# Patient Record
Sex: Female | Born: 1970 | Race: White | Hispanic: No | Marital: Married | Smoking: Never smoker
Health system: Southern US, Community
[De-identification: ages and names within clinical notes are randomized; demographics above are authoritative.]

## PROBLEM LIST (undated history)

## (undated) DIAGNOSIS — Z8489 Family history of other specified conditions: Secondary | ICD-10-CM

## (undated) DIAGNOSIS — B958 Unspecified staphylococcus as the cause of diseases classified elsewhere: Secondary | ICD-10-CM

## (undated) DIAGNOSIS — R112 Nausea with vomiting, unspecified: Secondary | ICD-10-CM

## (undated) DIAGNOSIS — F419 Anxiety disorder, unspecified: Secondary | ICD-10-CM

## (undated) DIAGNOSIS — E2839 Other primary ovarian failure: Secondary | ICD-10-CM

## (undated) HISTORY — PX: EYE SURGERY: SHX253

## (undated) HISTORY — DX: Unspecified staphylococcus as the cause of diseases classified elsewhere: B95.8

---
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
## 2008-01-23 ENCOUNTER — Emergency Department (HOSPITAL_COMMUNITY): Admission: EM | Admit: 2008-01-23 | Discharge: 2008-01-23 | Payer: Self-pay | Admitting: Emergency Medicine

## 2008-09-12 ENCOUNTER — Inpatient Hospital Stay (HOSPITAL_COMMUNITY): Admission: AD | Admit: 2008-09-12 | Discharge: 2008-09-12 | Payer: Self-pay | Admitting: Obstetrics & Gynecology

## 2011-08-14 LAB — I-STAT 8, (EC8 V) (CONVERTED LAB)
Glucose, Bld: 91
HCT: 41
Hemoglobin: 13.9
Sodium: 138
TCO2: 29
pCO2, Ven: 49.7
pH, Ven: 7.344 — ABNORMAL HIGH

## 2011-08-14 LAB — POCT I-STAT CREATININE
Creatinine, Ser: 1
Operator id: 288831

## 2011-08-14 LAB — POCT CARDIAC MARKERS
CKMB, poc: <1 — ABNORMAL LOW
Operator id: 288831
Troponin i, poc: <0.05

## 2012-04-09 ENCOUNTER — Encounter (INDEPENDENT_AMBULATORY_CARE_PROVIDER_SITE_OTHER): Payer: Self-pay | Admitting: General Surgery

## 2012-04-09 ENCOUNTER — Ambulatory Visit (INDEPENDENT_AMBULATORY_CARE_PROVIDER_SITE_OTHER): Payer: BC Managed Care – PPO | Admitting: Internal Medicine

## 2012-04-09 VITALS — BP 120/82 | HR 83 | Temp 98.3°F | Ht 66.5 in | Wt 146.8 lb

## 2012-04-09 DIAGNOSIS — L723 Sebaceous cyst: Secondary | ICD-10-CM

## 2012-04-09 NOTE — Patient Instructions (Signed)
Patient may use warm compresses 2-3 times daily. She will continue to monitor the area. I currently do not feel that she does need antibiotics. An ultrasound was performed today which showed no evidence of purulent collection but possibly that there is a small cyst there. Eventually she may need surgical removal of this if it becomes a more consistent problem.

## 2012-04-12 NOTE — Progress Notes (Signed)
Subjective:     Patient ID: Elna Breslow, female   DOB: 05/29/1971, 41 y.o.   MRN: [id number]  HPI Patient is a 41 yr old female who is referred to pur clinic by her primary care MD due to an area on her left lower leg.  She reports this area was first noticed the first week of April.  The area became very red and painful and swollen.  She had to be seen in the ED where she was diagnosed with an abscess and the area was incised and drained.  She has been on Bactrim for this area.  The area over the weekend became painful and slightly red.  She contacted her primary care MD who recommended being seen at a surgeons office.  She relates the pain is in the medial area of the lower leg and extends to the ankle.  She denies fevers or chills.  She denies swelling.  Review of Systems Review of systems is negative accept as indicated in HPI    Objective:   Physical Exam General: Well developed, well nourished, female who appears in no acute distress. Extremity: left lower medial leg has a small scar .5cm, small knotted area located under this but no fluctuance, erythema or significant sign of infection present.  No swelling present.    Assessment:     Residual scar tissue after abscess drainage but no current sign of drainable abscess.    Plan:     An ultrasound was performed today and there is no sign of drainable collection of fluid.  Likely the patient is having discomfort from the scar tissue remaining from the original abscess.  Recommend warm compresses and monitoring.  If the area continues to cause significant discomfort then the remaining cyst compartment can be considered for surgical removal.  The patient will contact us if the area worsens or continues not to improve.     Raunak Antuna 04/12/2012 6:00 AM

## 2015-09-09 ENCOUNTER — Encounter (HOSPITAL_COMMUNITY): Payer: Self-pay | Admitting: Neurology

## 2015-09-09 ENCOUNTER — Emergency Department (HOSPITAL_COMMUNITY)
Admission: EM | Admit: 2015-09-09 | Discharge: 2015-09-09 | Disposition: A | Discharge status: Home / Self Care | Payer: BLUE CROSS/BLUE SHIELD | Attending: Emergency Medicine | Admitting: Emergency Medicine

## 2015-09-09 ENCOUNTER — Emergency Department (HOSPITAL_COMMUNITY): Payer: BLUE CROSS/BLUE SHIELD

## 2015-09-09 DIAGNOSIS — Z792 Long term (current) use of antibiotics: Secondary | ICD-10-CM | POA: Diagnosis not present

## 2015-09-09 DIAGNOSIS — Y9389 Activity, other specified: Secondary | ICD-10-CM | POA: Diagnosis not present

## 2015-09-09 DIAGNOSIS — S92351A Displaced fracture of fifth metatarsal bone, right foot, initial encounter for closed fracture: Secondary | ICD-10-CM | POA: Diagnosis not present

## 2015-09-09 DIAGNOSIS — Y9289 Other specified places as the place of occurrence of the external cause: Secondary | ICD-10-CM | POA: Diagnosis not present

## 2015-09-09 DIAGNOSIS — Y998 Other external cause status: Secondary | ICD-10-CM | POA: Insufficient documentation

## 2015-09-09 DIAGNOSIS — S92911A Unspecified fracture of right toe(s), initial encounter for closed fracture: Secondary | ICD-10-CM

## 2015-09-09 DIAGNOSIS — W2201XA Walked into wall, initial encounter: Secondary | ICD-10-CM | POA: Insufficient documentation

## 2015-09-09 DIAGNOSIS — S99921A Unspecified injury of right foot, initial encounter: Secondary | ICD-10-CM | POA: Diagnosis present

## 2015-09-09 DIAGNOSIS — Z79899 Other long term (current) drug therapy: Secondary | ICD-10-CM | POA: Insufficient documentation

## 2015-09-09 MED ORDER — HYDROCODONE-ACETAMINOPHEN 5-325 MG PO TABS: 1.0000 | ORAL_TABLET | Freq: Four times a day (QID) | ORAL | Status: DC | PRN

## 2015-09-09 MED ORDER — LIDOCAINE HCL 2 % IJ SOLN
20.0000 mL | Freq: Once | INTRAMUSCULAR | Status: AC
  Administered 2015-09-09: 400 mg
  Filled 2015-09-09: qty 20

## 2015-09-09 MED ORDER — HYDROCODONE-ACETAMINOPHEN 5-325 MG PO TABS
1.0000 | ORAL_TABLET | Freq: Once | ORAL | Status: AC
  Administered 2015-09-09: 1 via ORAL
  Filled 2015-09-09: qty 1

## 2015-09-09 NOTE — ED Notes (Signed)
Pt stumped right pinky toe on the wall today. She thinks it is broken.

## 2015-09-09 NOTE — Discharge Instructions (Signed)
Please follow-up with her primary care provider in 2 weeks for reevaluation. If new or worsening signs or symptoms present please return to the emergency room for reevaluation

## 2015-09-09 NOTE — ED Provider Notes (Signed)
CSN: [id number]     Arrival date & time 09/09/15  8295 History  By signing my name below, I, Phillis Haggis, attest that this documentation has been prepared under the direction and in the presence of [employer], PA-C. Electronically Signed: Phillis Haggis, ED Scribe. 09/09/2015. 7:41 PM.  Chief Complaint  Patient presents with  . Toe Injury   The history is provided by the patient. No language interpreter was used.  HPI Comments: Marisa Harris is a 44 y.o. female who presents to the Emergency Department complaining of a right fifth toe injury onset earlier today. Pt states that she stubbed her toe on the wall and believes it is broken. Pt is unable to bear weight on the right foot and arrived to the ED via a wheelchair. She denies hx of similar injuries, numbness, or weakness.   Past Medical History  Diagnosis Date  . Staph infection     left inner leg   Past Surgical History  Procedure Laterality Date  . Knee surgery  1992    right   No family history on file. Social History  Substance Use Topics  . Smoking status: Never Smoker   . Smokeless tobacco: Never Used  . Alcohol Use: Yes     Comment: monthly   OB History    No data available     Review of Systems  All other systems reviewed and are negative.   Allergies  Codeine  Home Medications   Prior to Admission medications   Medication Sig Start Date End Date Taking? Authorizing Provider  b complex vitamins tablet Take 1 tablet by mouth daily.    Historical Provider, MD  HYDROcodone-acetaminophen (NORCO/VICODIN) 5-325 MG tablet Take 1 tablet by mouth every 6 (six) hours as needed. 09/09/15   Eyvonne Mechanic, PA-C  levonorgestrel (MIRENA) 20 MCG/24HR IUD 1 each by Intrauterine route once.    Historical Provider, MD  Multiple Vitamin (MULTIVITAMIN) tablet Take 1 tablet by mouth daily.    Historical Provider, MD  sulfamethoxazole-trimethoprim (BACTRIM DS) 800-160 MG per tablet Take 1 tablet by mouth 2 (two) times  daily.    Historical Provider, MD   BP 117/73 mmHg  Pulse 69  Temp(Src) 98.1 F (36.7 C) (Oral)  Resp 20  SpO2 97%  Physical Exam  Constitutional: She is oriented to person, place, and time. She appears well-developed and well-nourished.  HENT:  Head: Normocephalic and atraumatic.  Eyes: EOM are normal.  Neck: Normal range of motion. Neck supple.  Cardiovascular: Normal rate.   Pulmonary/Chest: Effort normal.  Musculoskeletal: Normal range of motion.  Obvious deformity of the right fifth toe; no soft tissue injury; sensation intact; cap refill intact; tenderness to palpation  Neurological: She is alert and oriented to person, place, and time.  Skin: Skin is warm and dry.  Psychiatric: She has a normal mood and affect. Her behavior is normal.  Nursing note and vitals reviewed.  ED Course  Procedures (including critical care time) DIAGNOSTIC STUDIES: Oxygen Saturation is 97% on RA, normal by my interpretation.    COORDINATION OF CARE: 6:53 PM-Discussed treatment plan which includes x-ray of toe with pt at bedside and pt agreed to plan.   Labs Review Labs Reviewed - No data to display  Imaging Review Dg Toe 5th Right  09/09/2015  CLINICAL DATA:  Stubbed RIGHT 5th toe on a doorway today, deformity EXAM: RIGHT FIFTH TOE COMPARISON:  None FINDINGS: Slightly oblique fractured this dull aspect of proximal phalanx RIGHT fifth toe displaced laterally.  No intra-articular extension. No additional fracture, dislocation or bone destruction. IMPRESSION: Displaced fracture proximal phalanx RIGHT fifth toe. Electronically Signed   By: Ulyses SouthwardMark  Boles M.D.   On: 09/09/2015 19:25   I have personally reviewed and evaluated these images and lab results as part of my medical decision-making.   EKG Interpretation None      MDM   Final diagnoses:  Phalanx fracture, foot, right, closed, initial encounter   Labs:  Imaging:  Dg Toe 5th Right  09/09/2015  CLINICAL DATA:  Stubbed RIGHT 5th  toe on a doorway today, deformity EXAM: RIGHT FIFTH TOE COMPARISON:  None FINDINGS: Slightly oblique fractured this dull aspect of proximal phalanx RIGHT fifth toe displaced laterally. No intra-articular extension. No additional fracture, dislocation or bone destruction. IMPRESSION: Displaced fracture proximal phalanx RIGHT fifth toe. Electronically Signed   By: Ulyses SouthwardMark  Boles M.D.   On: 09/09/2015 19:25   Consults:  Therapeutics:  Discharge Meds:   Assessment/Plan: 44 y.o. female who presents to the ED after hitting her right fifth toe on a wall. There is obvious deformity noted to the toe; will perform an x-ray  X-ray shows displaced fracture of the proximal phalanx. Will apply a digital block to the area and perform a realignment to the toe.   Anatomical alignment reached with digital block and slight traction, Refill present after alignment. Patient buddy taped, instructed follow-up with primary care in 2 weeks for reevaluation, sooner as needed. Strict return precautions given.   I personally performed the services described in this documentation, which was scribed in my presence. The recorded information has been reviewed and is accurate.    Eyvonne MechanicJeffrey Jaivyn Gulla, PA-C 09/10/15 [id number]  Mancel BaleElliott Wentz, MD 09/10/15 2350

## 2016-12-01 ENCOUNTER — Ambulatory Visit (HOSPITAL_COMMUNITY)
Admission: EM | Admit: 2016-12-01 | Discharge: 2016-12-01 | Disposition: A | Discharge status: Home / Self Care | Payer: BLUE CROSS/BLUE SHIELD | Attending: Emergency Medicine | Admitting: Emergency Medicine

## 2016-12-01 ENCOUNTER — Encounter (HOSPITAL_COMMUNITY): Payer: Self-pay | Admitting: *Deleted

## 2016-12-01 ENCOUNTER — Ambulatory Visit (INDEPENDENT_AMBULATORY_CARE_PROVIDER_SITE_OTHER): Payer: BLUE CROSS/BLUE SHIELD

## 2016-12-01 DIAGNOSIS — R091 Pleurisy: Secondary | ICD-10-CM | POA: Diagnosis not present

## 2016-12-01 DIAGNOSIS — R079 Chest pain, unspecified: Secondary | ICD-10-CM

## 2016-12-01 MED ORDER — HYDROCODONE-ACETAMINOPHEN 5-325 MG PO TABS: 1.0000 | ORAL_TABLET | ORAL | 0 refills | Status: DC | PRN

## 2016-12-01 MED ORDER — DICLOFENAC SODIUM 75 MG PO TBEC: 75.0000 mg | DELAYED_RELEASE_TABLET | Freq: Two times a day (BID) | ORAL | 0 refills | Status: DC

## 2016-12-01 MED ORDER — PREDNISONE 10 MG (21) PO TBPK: ORAL_TABLET | ORAL | 0 refills | Status: DC

## 2016-12-01 NOTE — ED Provider Notes (Signed)
HPI  SUBJECTIVE:  Marisa Harris is a 46 y.o. female who presents with over 12 days of central and right-sided chest pain that she describes as constant, achy, stabbing with movement. She reports shortness of breath and dyspnea on exertion although she states that she does not have to stop and rest. No wheezing. She was treated for bronchitis with a zpack, in mid to late December, states that she was coughing "lots". She states that she has not coughed since 12/30, but reports worsening right-sided chest pain. Symptoms are worse with right arm movement, torso rotation, sneezing, deep inspiration. No alleviating factors. She is been taking ibuprofen 800 mg twice a day and has tried icy hot. She denies chest rash, calf pain, leg swelling, hemoptysis, unintentional weight gain, orthopnea, PND, abdominal pain, nocturia. No recent trauma or surgery, exogenous estrogen. No trauma to the chest. She states that she does not feel like she broke her ribs. She has never had symptoms like this before. She has past medical history of cracked rib and states that the symptoms are not like that. No history of asthma, emphysema, COPD, smoking. No history of pneumothorax, DVT, PE, CHF. LMP: 4 years ago. PMD: None   Past Medical History:  Diagnosis Date  . Staph infection    left inner leg    Past Surgical History:  Procedure Laterality Date  . KNEE SURGERY  1992   right    History reviewed. No pertinent family history.  Social History  Substance Use Topics  . Smoking status: Never Smoker  . Smokeless tobacco: Never Used  . Alcohol use Yes     Comment: monthly    No current facility-administered medications for this encounter.   Current Outpatient Prescriptions:  .  diclofenac (VOLTAREN) 75 MG EC tablet, Take 1 tablet (75 mg total) by mouth 2 (two) times daily. Take with food, Disp: 30 tablet, Rfl: 0 .  HYDROcodone-acetaminophen (NORCO/VICODIN) 5-325 MG tablet, Take 1-2 tablets by mouth every 4  (four) hours as needed for moderate pain., Disp: 20 tablet, Rfl: 0 .  Multiple Vitamin (MULTIVITAMIN) tablet, Take 1 tablet by mouth daily., Disp: , Rfl:  .  predniSONE (STERAPRED UNI-PAK 21 TAB) 10 MG (21) TBPK tablet, Dispense one 6 day pack. Take as directed with food., Disp: 21 tablet, Rfl: 0  Allergies  Allergen Reactions  . Codeine Nausea Only     ROS  As noted in HPI.   Physical Exam  BP 124/73 (BP Location: Right Arm)   Pulse 74   Temp 98.1 F (36.7 C) (Oral)   Resp 18   SpO2 100%   Constitutional: Well developed, well nourished, no acute distress Eyes: PERRL, EOMI, conjunctiva normal bilaterally HENT: Normocephalic, atraumatic,mucus membranes moist Respiratory: Clear to auscultation bilaterally, no rales, no wheezing, no rhonchi. Positive costochondral tenderness at the sternum, midclavicular line. Positive diffuse tenderness over the entire right-sided rib cage. pain is aggravated with torso rotation and arm movement. No point bony tenderness. No appreciable crepitus. Patient able to take a deep breath in. Cardiovascular: Normal rate and rhythm, no murmurs, no gallops, no rubs GI: nondistended,  skin: No rash, skin intact Musculoskeletal:  calves symmetric, no edema, no tenderness, no deformities Neurologic: Alert & oriented x 3, CN II-XII grossly intact, no motor deficits, sensation grossly intact Psychiatric: Speech and behavior appropriate   ED Course   Medications - No data to display  Orders Placed This Encounter  Procedures  . DG Chest 2 View    Standing Status:  Standing    Number of Occurrences:   1    Order Specific Question:   Reason for Exam (SYMPTOM  OR DIAGNOSIS REQUIRED)    Answer:   R sided CP r/o PTX, effusion, PNA   No results found for this or any previous visit (from the past 24 hour(s)). Dg Chest 2 View  Result Date: 12/01/2016 CLINICAL DATA:  Patient states that after she became releived of cough at the end of December and since she  had a constant chest pain along rt side breast/ribs area. Non-smoker EXAM: CHEST  2 VIEW COMPARISON:  None. FINDINGS: Midline trachea. Normal heart size and mediastinal contours. No pleural effusion or pneumothorax. Clear lungs. IMPRESSION: No acute cardiopulmonary disease. Electronically Signed   By: Jeronimo GreavesKyle  Talbot M.D.   On: 12/01/2016 19:51    ED Clinical Impression  Right-sided chest pain  Pleurisy   ED Assessment/Plan  No previous records available.  Pt PERC neg. Has normal VS.  Doubt PE. Checking chest x-ray rule out effusion, pneumothorax. She has diffuse chest wall tenderness but no point tenderness suggestive of a broken rib. Presentation most consistent with musculoskeletal chest pain vs pleurisy. Doubt myocarditis, given the location of the pain and the fact that it is reproducible.  Reviewed imaging independently and discussed with radiology. No pleural effusion, pneumothorax. No rib fractures. Clear lungs. See radiology report for details.  Plan to send home with ibuprofen 800 mg 1 g of Tylenol or with 1-2 tabs of Norco and a 6 day prednisone taper. We'll provide a primary care referral. To the ER if she gets worse.  Discussed labs, imaging, MDM, plan and followup with patient. Discussed sn/sx that should prompt return to the ED. Patient agrees with plan.   Meds ordered this encounter  Medications  . diclofenac (VOLTAREN) 75 MG EC tablet    Sig: Take 1 tablet (75 mg total) by mouth 2 (two) times daily. Take with food    Dispense:  30 tablet    Refill:  0  . HYDROcodone-acetaminophen (NORCO/VICODIN) 5-325 MG tablet    Sig: Take 1-2 tablets by mouth every 4 (four) hours as needed for moderate pain.    Dispense:  20 tablet    Refill:  0  . predniSONE (STERAPRED UNI-PAK 21 TAB) 10 MG (21) TBPK tablet    Sig: Dispense one 6 day pack. Take as directed with food.    Dispense:  21 tablet    Refill:  0    *This clinic note was created using Scientist, clinical (histocompatibility and immunogenetics)Dragon dictation software.  Therefore, there may be occasional mistakes despite careful proofreading.  ?   Domenick GongAshley Montine Hight, MD 12/01/16 2130

## 2016-12-01 NOTE — ED Triage Notes (Signed)
Has  Been  Sick     For   About  1  Month  Had  Lots  Of  Coughing   Seen             2.5  Weeks  Ago took  meds  Now  Has  Pain upper  Chest  Radiating  To  Back   Worse   On   Movement  And  Takes  Deep  Breath

## 2018-04-20 IMAGING — DX DG CHEST 2V
2 series · 2 of 2 positions shown · non-contrast
Comparison: None.

CLINICAL DATA: Patient states that after she became releived of
cough at the end [DATE] and since she had a constant chest pain
along rt side breast/ribs area. Non-smoker

EXAM:
CHEST  2 VIEW

[chest pa]
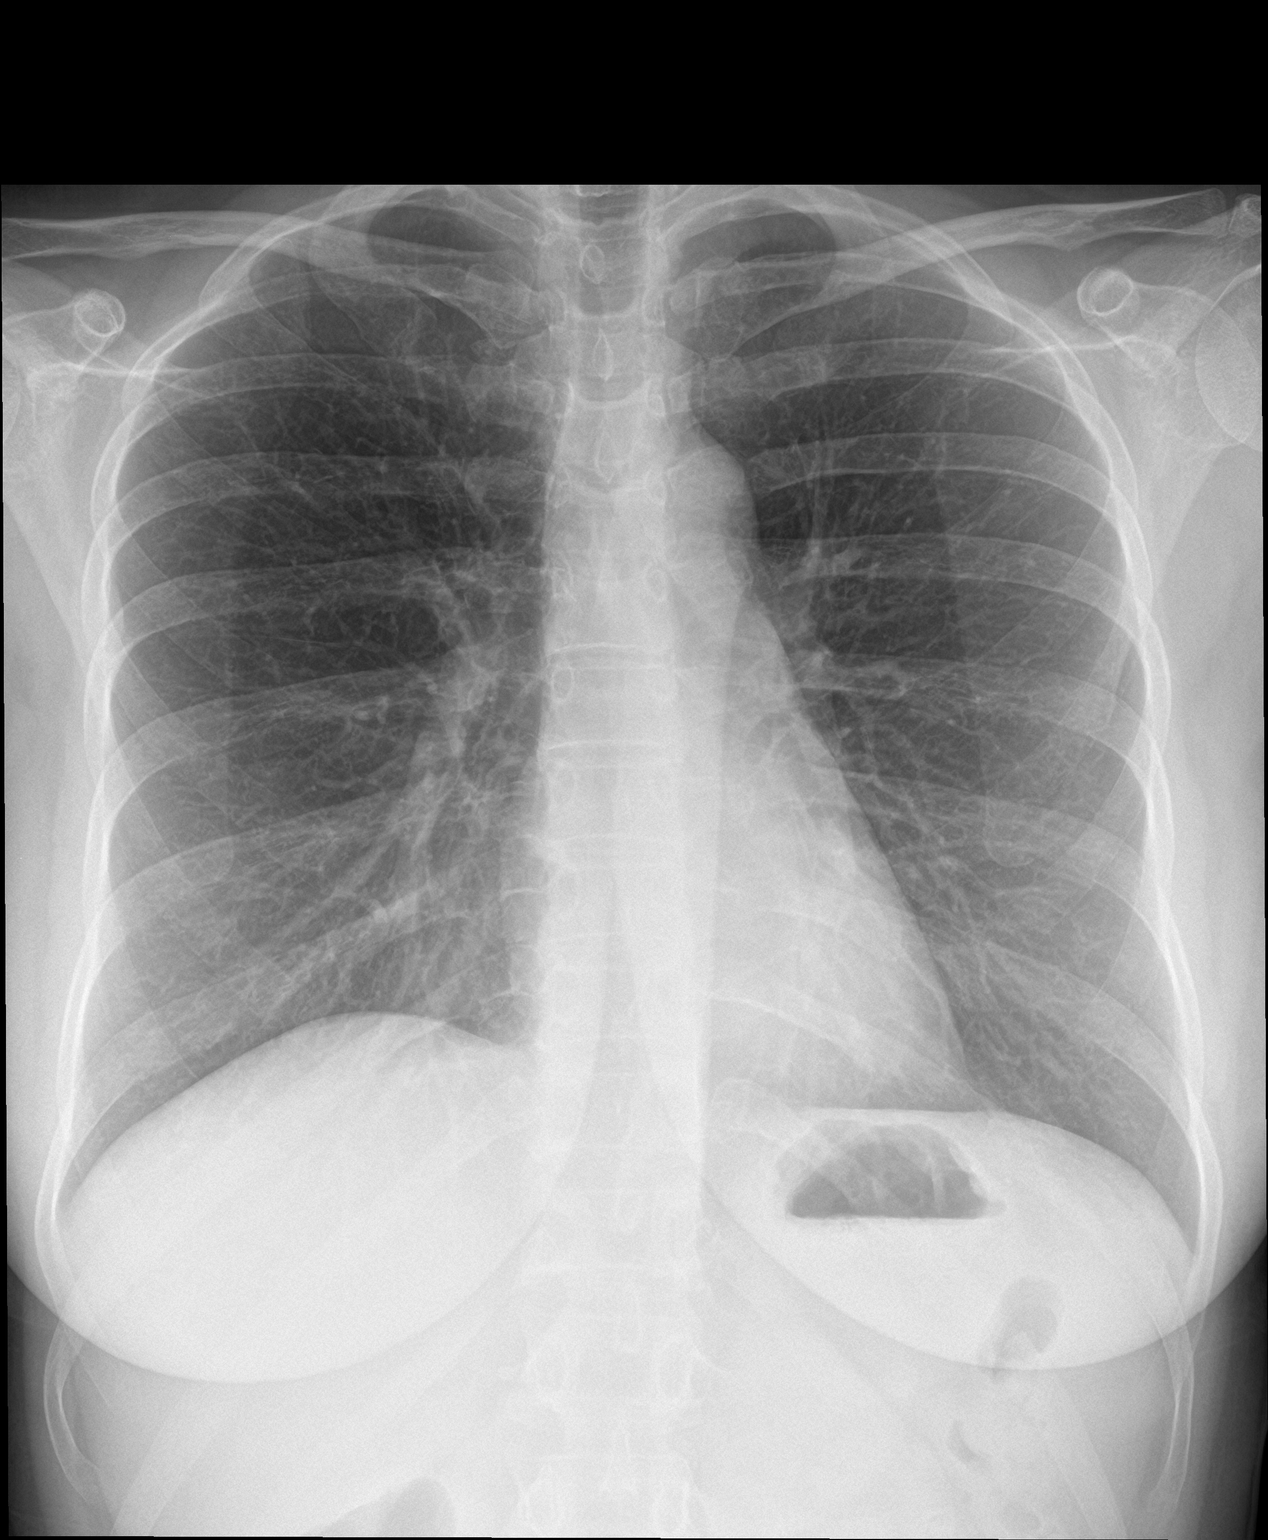

[chest lat]
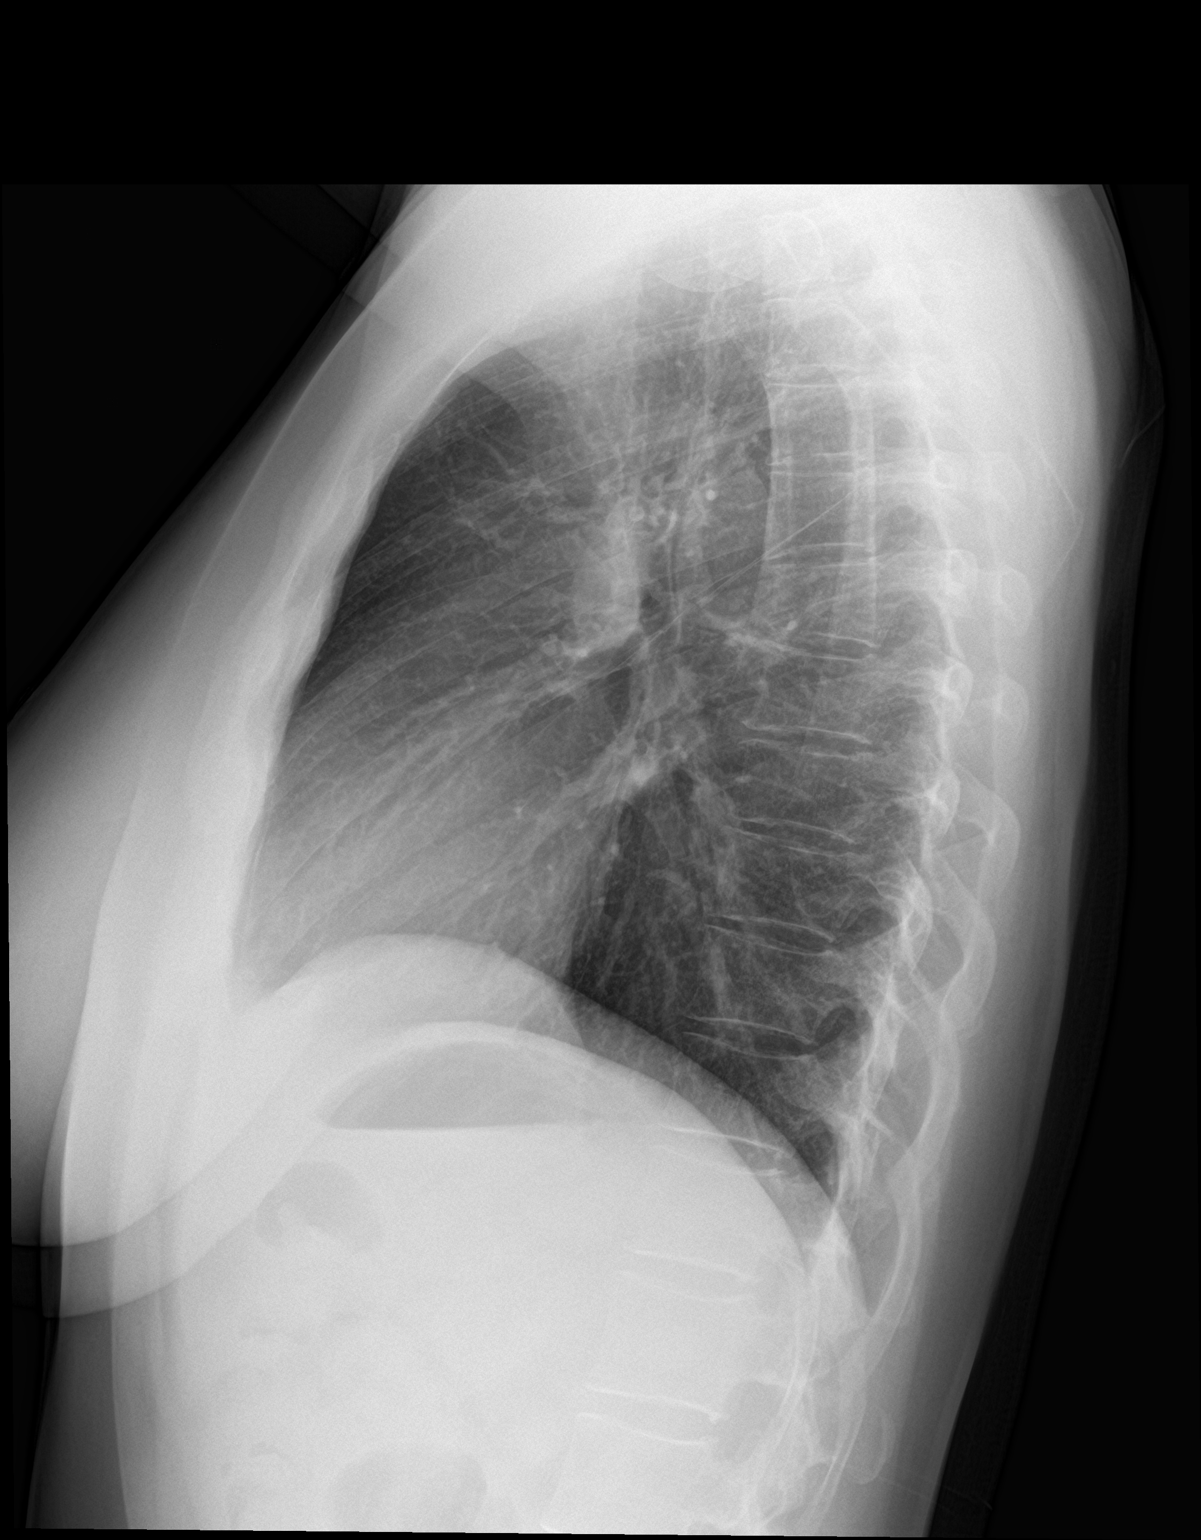

[2 of 2 positions shown; findings below may reference images not displayed]

FINDINGS: Midline trachea. Normal heart size and mediastinal contours. No
pleural effusion or pneumothorax. Clear lungs.
IMPRESSION: No acute cardiopulmonary disease.

## 2022-06-20 ENCOUNTER — Ambulatory Visit (INDEPENDENT_AMBULATORY_CARE_PROVIDER_SITE_OTHER): Payer: BC Managed Care – PPO | Admitting: Plastic Surgery

## 2022-06-20 ENCOUNTER — Encounter: Payer: Self-pay | Admitting: Plastic Surgery

## 2022-06-20 VITALS — BP 116/70 | HR 61 | Ht 67.0 in | Wt 166.6 lb

## 2022-06-20 DIAGNOSIS — N62 Hypertrophy of breast: Secondary | ICD-10-CM | POA: Insufficient documentation

## 2022-06-20 DIAGNOSIS — R21 Rash and other nonspecific skin eruption: Secondary | ICD-10-CM

## 2022-06-20 DIAGNOSIS — M542 Cervicalgia: Secondary | ICD-10-CM | POA: Diagnosis not present

## 2022-06-20 DIAGNOSIS — M546 Pain in thoracic spine: Secondary | ICD-10-CM | POA: Diagnosis not present

## 2022-06-20 DIAGNOSIS — M549 Dorsalgia, unspecified: Secondary | ICD-10-CM | POA: Insufficient documentation

## 2022-06-20 DIAGNOSIS — Z6826 Body mass index (BMI) 26.0-26.9, adult: Secondary | ICD-10-CM

## 2022-06-20 DIAGNOSIS — Z1231 Encounter for screening mammogram for malignant neoplasm of breast: Secondary | ICD-10-CM

## 2022-06-20 DIAGNOSIS — M545 Low back pain, unspecified: Secondary | ICD-10-CM

## 2022-06-20 DIAGNOSIS — G8929 Other chronic pain: Secondary | ICD-10-CM

## 2022-06-20 NOTE — Progress Notes (Signed)
Patient ID: Marisa Harris, female    DOB: 1970-12-02, 51 y.o.   MRN: [id number]   Chief Complaint  Patient presents with   Advice Only    Mammary Hyperplasia: The patient is a 51 y.o. female with a history of mammary hyperplasia for several years.  She has extremely large breasts causing symptoms that include the following: Back pain in the upper and lower back, including neck pain. She pulls or pins her bra straps to provide better lift and relief of the pressure and pain. She notices relief by holding her breast up manually.  Her shoulder straps cause grooves and pain and pressure that requires padding for relief. Pain medication is sometimes required with motrin and tylenol.  Activities that are hindered by enlarged breasts include: exercise and running.  She has tried supportive clothing as well as fitted bras without improvement.  Her breasts are extremely large and fairly symmetric.  She has hyperpigmentation of the inframammary area on both sides.  The sternal to nipple distance on the right is 28 cm and the left is 29 cm.  The IMF distance is 10 cm.  She is 5 feet 7 inches tall and weighs 166 pounds.  The BMI = 26 kg/m.  Preoperative bra size = DDD cup. She would like to be a C cup.  The estimated excess breast tissue to be removed at the time of surgery = 500 grams on the left and 500 grams on the right.  Mammogram history: July 2022.  Family history of breast cancer:  no.  Tobacco use:  no.   The patient expresses the desire to pursue surgical intervention.     Review of Systems  Constitutional:  Positive for activity change. Negative for appetite change.  HENT: Negative.    Eyes: Negative.   Respiratory: Negative.  Negative for chest tightness.   Cardiovascular: Negative.   Gastrointestinal: Negative.   Endocrine: Negative.   Genitourinary: Negative.   Musculoskeletal:  Positive for back pain and neck pain.  Skin:  Positive for rash.  Neurological: Negative.    Hematological: Negative.   Psychiatric/Behavioral: Negative.      Past Medical History:  Diagnosis Date   Staph infection    left inner leg    Past Surgical History:  Procedure Laterality Date   KNEE SURGERY  1992   right      Current Outpatient Medications:    JUNEL FE 24 1-20 MG-MCG(24) tablet, Take 1 tablet by mouth daily., Disp: , Rfl:    Multiple Vitamin (MULTIVITAMIN) tablet, Take 1 tablet by mouth daily., Disp: , Rfl:    Objective:   Vitals:   06/20/22 1318  BP: 116/70  Pulse: 61  SpO2: 98%    Physical Exam Vitals reviewed.  Constitutional:      Appearance: Normal appearance.  HENT:     Head: Normocephalic and atraumatic.  Cardiovascular:     Rate and Rhythm: Normal rate.     Pulses: Normal pulses.  Pulmonary:     Effort: Pulmonary effort is normal.  Abdominal:     General: There is no distension.     Palpations: Abdomen is soft.  Musculoskeletal:        General: No swelling or deformity.  Skin:    General: Skin is warm.     Capillary Refill: Capillary refill takes less than 2 seconds.     Coloration: Skin is not jaundiced.     Findings: No bruising.  Neurological:  Mental Status: She is alert and oriented to person, place, and time.  Psychiatric:        Mood and Affect: Mood normal.        Behavior: Behavior normal.        Thought Content: Thought content normal.        Judgment: Judgment normal.     Assessment & Plan:  Breast hypertrophy - Plan: Ambulatory referral to Physical Therapy  Encounter for screening mammogram for malignant neoplasm of breast - Plan: MM DIGITAL SCREENING BILATERAL  Neck pain, bilateral - Plan: Ambulatory referral to Physical Therapy  Upper back pain - Plan: Ambulatory referral to Physical Therapy  Patient is a good candidate for bilateral breast reduction with possible liposuction.  We will get her set up for physical therapy and an updated mammogram.  We will plan to see her back in 3 months for  follow-up.  Pictures were obtained of the patient and placed in the chart with the patient's or guardian's permission.   Alena Bills Asante Blanda, DO

## 2022-06-26 ENCOUNTER — Telehealth: Payer: Self-pay | Admitting: *Deleted

## 2022-06-26 NOTE — Telephone Encounter (Signed)
Called and [facility] @ 10:42am) asking the patient to return call regarding office notes that were returned.//AB/CMA

## 2022-07-05 ENCOUNTER — Ambulatory Visit: Payer: BC Managed Care – PPO | Admitting: Physical Therapy

## 2022-07-12 ENCOUNTER — Telehealth: Payer: Self-pay | Admitting: Plastic Surgery

## 2022-07-12 NOTE — Telephone Encounter (Signed)
Spoke with pt regarding PT. Advised her that she could go to the practice where she is an established patient but would need a new evaluation for neck and back pain r/t large breasts. Also that she would need at least 6 weeks of therapy before it could be submitted to insurance for authorization. She verbalized understanding.

## 2022-07-12 NOTE — Telephone Encounter (Signed)
Pt is calling in stating that went to the PT that she was referred to by our office and it is too expensive w/her insurance and would like to know if she can just go to her own PT that she is going to.  Pt would like to have a call back to let her know

## 2022-09-12 NOTE — Progress Notes (Addendum)
   Referring Provider Jonathon Jordan, MD [address] [place],  [place] [address]   CC:  Chief Complaint  Patient presents with   Follow-up      Marisa Harris is an 51 y.o. female.  HPI: Patient is a 51 y.o. year old female here for follow up after completing physical therapy for pain related to macromastia.   She was seen for initial consult by Dr. Marla Roe.  At that time, she complained of chronic upper back and neck discomfort in the context of large breasts.  STN 28 cm on the right, 29 cm in the left.  BMI equals 26 kg/m.  Preoperative bra size equals DDD cup.  She expressed that she would like to be a C cup postoperatively.  Estimated excess breast tissue removed at time of surgery equals 500 g each side.  Patient denied any tobacco use or family history of breast cancer.  Last mammogram was 05/2021, plan was for updated mammogram and physical therapy to see if it would improve her chronic back discomfort.  Today, patient is doing okay.  She states that, unfortunately, going to PT exacerbated her chronic upper back and neck discomfort in the context of her large breasts.  She did not experience any relief and has been having muscle spasms for which she has been treating with muscle relaxants.  She does not frequently experience inframammary rashes, but she does experience considerable shoulder grooving and tells me that certain exercises are inhibited due to her large breast size.  She would still very much like to proceed with breast reduction surgery given her symptomatic macromastia.  She plans to get her mammogram done in the next few weeks.   Review of Systems General: Denies fevers MSK: Endorses ongoing back and neck discomfort Skin: Denies rashes  Physical Exam    06/20/2022    1:18 PM 12/01/2016    6:04 PM 09/09/2015    8:56 PM  Vitals with BMI  Height 5\' 7"     Weight 166 lbs 10 oz    BMI 88.41    Systolic 660 630 160  Diastolic 70 73 66  Pulse 61 74  63    General:  No acute distress,  Alert and oriented, Non-Toxic, Normal speech and affect Psych: Normal behavior and mood Respiratory: No increased WOB MSK: Ambulatory  Assessment/Plan  Patient is interested in pursuing surgical intervention for bilateral breast reduction. Patient has completed at least 6 weeks of physical therapy for pain related to macromastia.  Discussed with patient we would submit to insurance for authorization, discussed approval could take up to 6 weeks.   [name] 09/13/2022, 1:39 PM

## 2022-09-13 ENCOUNTER — Ambulatory Visit (INDEPENDENT_AMBULATORY_CARE_PROVIDER_SITE_OTHER): Payer: BC Managed Care – PPO | Admitting: Physician Assistant

## 2022-09-13 DIAGNOSIS — M542 Cervicalgia: Secondary | ICD-10-CM

## 2022-09-13 DIAGNOSIS — M546 Pain in thoracic spine: Secondary | ICD-10-CM | POA: Diagnosis not present

## 2022-09-13 DIAGNOSIS — N62 Hypertrophy of breast: Secondary | ICD-10-CM | POA: Diagnosis not present

## 2022-09-13 DIAGNOSIS — Z6826 Body mass index (BMI) 26.0-26.9, adult: Secondary | ICD-10-CM

## 2022-09-14 ENCOUNTER — Telehealth: Payer: Self-pay

## 2022-09-14 NOTE — Telephone Encounter (Signed)
Faxed release of information form to [facility] to request physical therapy ov notes. Received confirmation fax and forwarded original document to front desk for batch scanning.

## 2022-10-03 ENCOUNTER — Telehealth: Payer: Self-pay

## 2022-10-03 ENCOUNTER — Telehealth: Payer: Self-pay | Admitting: *Deleted

## 2022-10-03 NOTE — Telephone Encounter (Signed)
Refaxed ROI to [facility]: [phone] with confirmed receipt. Pt aware.

## 2022-10-03 NOTE — Telephone Encounter (Signed)
Sent Mychart message to [employer] that we still need her to complete the mammo that was ordered in August for her insurance auth. Referral notes states on 8/9 and 8/24 someone from the breast center attempted to contact the patient and get her scheduled.

## 2022-11-16 ENCOUNTER — Telehealth: Payer: Self-pay | Admitting: *Deleted

## 2022-11-16 NOTE — Telephone Encounter (Signed)
Auth submitted via availity for breast reduction [id number]

## 2022-11-24 ENCOUNTER — Telehealth: Payer: Self-pay | Admitting: *Deleted

## 2022-11-27 ENCOUNTER — Telehealth: Payer: Self-pay | Admitting: Plastic Surgery

## 2022-11-27 NOTE — Telephone Encounter (Signed)
Pt is wondering if a tummy tuck could be done at the same time as her other surgery?

## 2022-11-29 ENCOUNTER — Telehealth: Payer: Self-pay | Admitting: *Deleted

## 2022-11-29 NOTE — Telephone Encounter (Signed)
Approval for breast reduction received   [id number] valid 11/20/22 - 11/20/23.  Will notify patient to see if she wants to schedule or wait until tummy consult

## 2022-12-01 ENCOUNTER — Telehealth: Payer: Self-pay | Admitting: *Deleted

## 2022-12-01 NOTE — Telephone Encounter (Signed)
Spoke with patient to schedule sx and related appts  

## 2022-12-05 ENCOUNTER — Encounter: Payer: Self-pay | Admitting: Surgical

## 2022-12-05 ENCOUNTER — Ambulatory Visit (INDEPENDENT_AMBULATORY_CARE_PROVIDER_SITE_OTHER): Payer: BC Managed Care – PPO | Admitting: Surgical

## 2022-12-05 VITALS — BP 131/75 | HR 75 | Ht 67.0 in | Wt 161.8 lb

## 2022-12-05 DIAGNOSIS — N62 Hypertrophy of breast: Secondary | ICD-10-CM

## 2022-12-05 DIAGNOSIS — M546 Pain in thoracic spine: Secondary | ICD-10-CM

## 2022-12-05 DIAGNOSIS — G8929 Other chronic pain: Secondary | ICD-10-CM

## 2022-12-05 DIAGNOSIS — M542 Cervicalgia: Secondary | ICD-10-CM

## 2022-12-05 MED ORDER — ONDANSETRON HCL 4 MG PO TABS: 4.0000 mg | ORAL_TABLET | Freq: Three times a day (TID) | ORAL | 0 refills | Status: DC | PRN

## 2022-12-05 MED ORDER — OXYCODONE HCL 5 MG PO TABS: 5.0000 mg | ORAL_TABLET | Freq: Four times a day (QID) | ORAL | 0 refills | Status: AC | PRN

## 2022-12-05 MED ORDER — CEPHALEXIN 500 MG PO CAPS: 500.0000 mg | ORAL_CAPSULE | Freq: Four times a day (QID) | ORAL | 0 refills | Status: AC

## 2022-12-05 NOTE — H&P (View-Only) (Signed)
   Patient ID: Marisa Harris, female    DOB: 06/19/1971, 51 y.o.   MRN: [id number]  Chief Complaint  Patient presents with   Pre-op Exam      ICD-10-CM   1. Breast hypertrophy  N62     2. Neck pain, bilateral  M54.2     3. Chronic bilateral thoracic back pain  M54.6    G89.29     4. Neck pain  M54.2     5. Symptomatic mammary hypertrophy  N62        History of Present Illness: Marisa Harris is a 51 y.o.  female  with a history of macromastia.  She presents for preoperative evaluation for upcoming procedure, Bilateral Breast Reduction with possible liposuction, scheduled for 12/13/2022 with Dr.  Dillingham  She reports history of nausea and vomiting with anesthesia, no other complications. No history of DVT/PE.  No family history of DVT/PE.  No family or personal history of bleeding or clotting disorders.  Patient is not currently taking any blood thinners.  No history of CVA/MI.   Summary of Previous Visit: STN on the right is 28 cm and the left is 29 cm.  Preoperative bra size is a triple D cup, would like to be about a C/D cup.  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  She does not have any changes in her recent past medical history.  She is little bit anxious for surgery  Past Medical History: Allergies: Allergies  Allergen Reactions   Codeine Nausea Only and Nausea And Vomiting    Current Medications:  Current Outpatient Medications:    escitalopram (LEXAPRO) 10 MG tablet, Take 10 mg by mouth daily., Disp: , Rfl:    JUNEL FE 24 1-20 MG-MCG(24) tablet, Take 1 tablet by mouth daily., Disp: , Rfl:    magnesium oxide (MAG-OX) 400 (240 Mg) MG tablet, Take 400 mg by mouth daily., Disp: , Rfl:    Multiple Vitamin (MULTIVITAMIN) tablet,  Take 1 tablet by mouth daily., Disp: , Rfl:   Past Medical Problems: Past Medical History:  Diagnosis Date   Staph infection    left inner leg    Past Surgical History: Past Surgical History:  Procedure Laterality Date   KNEE SURGERY  1992   right    Social History: Social History   Socioeconomic History   Marital status: Married    Spouse name: Not on file   Number of children: Not on file   Years of education: Not on file   Highest education level: Not on file  Occupational History   Not on file  Tobacco Use   Smoking status: Never   Smokeless tobacco: Never  Substance and Sexual Activity   Alcohol use: Yes    Comment: monthly   Drug use: No   Sexual activity: Not on file  Other Topics Concern   Not on file  Social History Narrative   Not on file   Social Determinants of Health   Financial Resource Strain: Not on file  Food Insecurity: Not on file  Transportation Needs: Not on file  Physical Activity: Not on file  Stress: Not on file  Social Connections: Not on file  Intimate Partner Violence: Not on file      Family History: No family history on file.  Review of Systems: Review of Systems  Constitutional: Negative.   Respiratory: Negative.    Cardiovascular: Negative.   Gastrointestinal: Negative.   Genitourinary: Negative.   Neurological: Negative.     Physical Exam: Vital Signs BP 131/75 (BP Location: Left Arm, Patient Position: Sitting, Cuff Size: Small)   Pulse 75   Ht 5\' 7"  (1.702 m)   Wt 161 lb 12.8 oz (73.4 kg)   SpO2 97%   BMI 25.34 kg/m   Physical Exam  Constitutional:      General: Not in acute distress.    Appearance: Normal appearance. Not ill-appearing.  HENT:     Head: Normocephalic and atraumatic.  Eyes:     Pupils: Pupils are equal, round Neck:     Musculoskeletal: Normal range of motion.  Cardiovascular:     Rate and Rhythm: Normal rate    Pulses: Normal pulses.  Pulmonary:     Effort: Pulmonary effort is  normal. No respiratory distress.  Musculoskeletal: Normal range of motion.  Skin:    General: Skin is warm and dry.     Findings: No erythema or rash.  Neurological:     General: No focal deficit present.     Mental Status: Alert and oriented to person, place, and time. Mental status is at baseline.     Motor: No weakness.  Psychiatric:        Mood and Affect: Mood normal.        Behavior: Behavior normal.    Assessment/Plan: The patient is scheduled for bilateral breast reduction with Dr. Marla Roe.  Risks, benefits, and alternatives of procedure discussed, questions answered and consent obtained.    Smoking Status: Non-smoker; Counseling Given?  N/A Last Mammogram: Patient reports she had her mammogram in December 2023, BI-RADS Category 2, benign.  Results reviewed via Care Everywhere.  Caprini Score: 4, moderate; Risk Factors include: Age, on OCPs, and length of planned surgery. Recommendation for mechanical prophylaxis. Encourage early ambulation.   Pictures obtained: @consult   Post-op Rx sent to pharmacy: Oxycodone, Zofran, Keflex  Patient was provided with the breast reduction and General Surgical Risk consent document and Pain Medication Agreement prior to their appointment.  They had adequate time to read through the risk consent documents and Pain Medication Agreement. We also discussed them in person together during this preop appointment. All of their questions were answered to their satisfaction.  Recommended calling if they have any further questions.  Risk consent form and Pain Medication Agreement to be scanned into patient's chart.  The risk that can be encountered with breast reduction were discussed and include the following but not limited to these:  Breast asymmetry, fluid accumulation, firmness of the breast, inability to breast feed, loss of nipple or areola, skin loss, decrease or no nipple sensation, fat necrosis of the breast tissue, bleeding, infection, healing  delay.  There are risks of anesthesia, changes to skin sensation and injury to nerves or blood vessels.  The muscle can be temporarily or permanently injured.  You may have an allergic reaction to tape, suture, glue, blood products which can result in skin discoloration, swelling, pain, skin lesions, poor healing.  Any of these can lead to the need for revisonal surgery or stage procedures.  A reduction has potential to interfere with diagnostic procedures.  Nipple or breast piercing can increase risks of infection.  This procedure is best done when the breast is fully developed.  Changes in the breast will continue  to occur over time.  Pregnancy can alter the outcomes of previous breast reduction surgery, weight gain and weigh loss can also effect the long term appearance.     Electronically signed by: Carola Rhine Valeria Krisko, PA-C 12/05/2022 3:00 PM

## 2022-12-05 NOTE — Progress Notes (Addendum)
Patient ID: Marisa Harris, female    DOB: 1971-05-21, 52 y.o.   MRN: [id number]  Chief Complaint  Patient presents with   Pre-op Exam      ICD-10-CM   1. Breast hypertrophy  N62     2. Neck pain, bilateral  M54.2     3. Chronic bilateral thoracic back pain  M54.6    G89.29     4. Neck pain  M54.2     5. Symptomatic mammary hypertrophy  N62        History of Present Illness: Marisa Harris is a 52 y.o.  female  with a history of macromastia.  She presents for preoperative evaluation for upcoming procedure, Bilateral Breast Reduction with possible liposuction, scheduled for 12/13/2022 with Dr.  Ulice Bold  She reports history of nausea and vomiting with anesthesia, no other complications. No history of DVT/PE.  No family history of DVT/PE.  No family or personal history of bleeding or clotting disorders.  Patient is not currently taking any blood thinners.  No history of CVA/MI.   Summary of Previous Visit: STN on the right is 28 cm and the left is 29 cm.  Preoperative bra size is a triple D cup, would like to be about a C/D cup.  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  She does not have any changes in her recent past medical history.  She is little bit anxious for surgery  Past Medical History: Allergies: Allergies  Allergen Reactions   Codeine Nausea Only and Nausea And Vomiting    Current Medications:  Current Outpatient Medications:    escitalopram (LEXAPRO) 10 MG tablet, Take 10 mg by mouth daily., Disp: , Rfl:    JUNEL FE 24 1-20 MG-MCG(24) tablet, Take 1 tablet by mouth daily., Disp: , Rfl:    magnesium oxide (MAG-OX) 400 (240 Mg) MG tablet, Take 400 mg by mouth daily., Disp: , Rfl:    Multiple Vitamin (MULTIVITAMIN) tablet,  Take 1 tablet by mouth daily., Disp: , Rfl:   Past Medical Problems: Past Medical History:  Diagnosis Date   Staph infection    left inner leg    Past Surgical History: Past Surgical History:  Procedure Laterality Date   KNEE SURGERY  1992   right    Social History: Social History   Socioeconomic History   Marital status: Married    Spouse name: Not on file   Number of children: Not on file   Years of education: Not on file   Highest education level: Not on file  Occupational History   Not on file  Tobacco Use   Smoking status: Never   Smokeless tobacco: Never  Substance and Sexual Activity   Alcohol use: Yes    Comment: monthly   Drug use: No   Sexual activity: Not on file  Other Topics Concern   Not on file  Social History Narrative   Not on file   Social Determinants of Health   Financial Resource Strain: Not on file  Food Insecurity: Not on file  Transportation Needs: Not on file  Physical Activity: Not on file  Stress: Not on file  Social Connections: Not on file  Intimate Partner Violence: Not on file  Family History: No family history on file.  Review of Systems: Review of Systems  Constitutional: Negative.   Respiratory: Negative.    Cardiovascular: Negative.   Gastrointestinal: Negative.   Genitourinary: Negative.   Neurological: Negative.     Physical Exam: Vital Signs BP 131/75 (BP Location: Left Arm, Patient Position: Sitting, Cuff Size: Small)   Pulse 75   Ht 5\' 7"  (1.702 m)   Wt 161 lb 12.8 oz (73.4 kg)   SpO2 97%   BMI 25.34 kg/m   Physical Exam  Constitutional:      General: Not in acute distress.    Appearance: Normal appearance. Not ill-appearing.  HENT:     Head: Normocephalic and atraumatic.  Eyes:     Pupils: Pupils are equal, round Neck:     Musculoskeletal: Normal range of motion.  Cardiovascular:     Rate and Rhythm: Normal rate    Pulses: Normal pulses.  Pulmonary:     Effort: Pulmonary effort is  normal. No respiratory distress.  Musculoskeletal: Normal range of motion.  Skin:    General: Skin is warm and dry.     Findings: No erythema or rash.  Neurological:     General: No focal deficit present.     Mental Status: Alert and oriented to person, place, and time. Mental status is at baseline.     Motor: No weakness.  Psychiatric:        Mood and Affect: Mood normal.        Behavior: Behavior normal.    Assessment/Plan: The patient is scheduled for bilateral breast reduction with Dr. Marla Roe.  Risks, benefits, and alternatives of procedure discussed, questions answered and consent obtained.    Smoking Status: Non-smoker; Counseling Given?  N/A Last Mammogram: Patient reports she had her mammogram in December 2023, BI-RADS Category 2, benign.  Results reviewed via Care Everywhere.  Caprini Score: 4, moderate; Risk Factors include: Age, on OCPs, and length of planned surgery. Recommendation for mechanical prophylaxis. Encourage early ambulation.   Pictures obtained: @consult   Post-op Rx sent to pharmacy: Oxycodone, Zofran, Keflex  Patient was provided with the breast reduction and General Surgical Risk consent document and Pain Medication Agreement prior to their appointment.  They had adequate time to read through the risk consent documents and Pain Medication Agreement. We also discussed them in person together during this preop appointment. All of their questions were answered to their satisfaction.  Recommended calling if they have any further questions.  Risk consent form and Pain Medication Agreement to be scanned into patient's chart.  The risk that can be encountered with breast reduction were discussed and include the following but not limited to these:  Breast asymmetry, fluid accumulation, firmness of the breast, inability to breast feed, loss of nipple or areola, skin loss, decrease or no nipple sensation, fat necrosis of the breast tissue, bleeding, infection, healing  delay.  There are risks of anesthesia, changes to skin sensation and injury to nerves or blood vessels.  The muscle can be temporarily or permanently injured.  You may have an allergic reaction to tape, suture, glue, blood products which can result in skin discoloration, swelling, pain, skin lesions, poor healing.  Any of these can lead to the need for revisonal surgery or stage procedures.  A reduction has potential to interfere with diagnostic procedures.  Nipple or breast piercing can increase risks of infection.  This procedure is best done when the breast is fully developed.  Changes in the breast will continue  to occur over time.  Pregnancy can alter the outcomes of previous breast reduction surgery, weight gain and weigh loss can also effect the long term appearance.     Electronically signed by: Carola Rhine Rachit Grim, PA-C 12/05/2022 3:00 PM

## 2022-12-06 ENCOUNTER — Encounter (HOSPITAL_BASED_OUTPATIENT_CLINIC_OR_DEPARTMENT_OTHER): Payer: Self-pay | Admitting: Plastic Surgery

## 2022-12-06 ENCOUNTER — Other Ambulatory Visit: Payer: Self-pay

## 2022-12-06 NOTE — Telephone Encounter (Signed)
Opened in error.//AB/CMA 

## 2022-12-12 ENCOUNTER — Ambulatory Visit (INDEPENDENT_AMBULATORY_CARE_PROVIDER_SITE_OTHER): Payer: BC Managed Care – PPO | Admitting: Plastic Surgery

## 2022-12-12 ENCOUNTER — Encounter: Payer: Self-pay | Admitting: Plastic Surgery

## 2022-12-12 VITALS — BP 120/79 | HR 69 | Ht 67.0 in | Wt 161.0 lb

## 2022-12-12 DIAGNOSIS — Z719 Counseling, unspecified: Secondary | ICD-10-CM

## 2022-12-12 DIAGNOSIS — K429 Umbilical hernia without obstruction or gangrene: Secondary | ICD-10-CM | POA: Diagnosis not present

## 2022-12-12 NOTE — Progress Notes (Signed)
   Subjective:    Patient ID: Marisa Harris, female    DOB: 1971-01-02, 52 y.o.   MRN: [id number]  The patient is a 52 year old female here for evaluation of her abdomen.  She is having a breast reduction by me tomorrow as well.  She is 5 feet 7 inches tall and weighs 161 pounds.  She has what appears to be a hernia in her umbilicus.  She says it has been there for years and she was not sure what it was.  She has noticed it ever since she had her last kid.  She is interested and tightening up her abdomen.  She has good muscle strength.  She is otherwise in good health.      Review of Systems  Constitutional: Negative.   HENT: Negative.    Eyes: Negative.   Respiratory: Negative.  Negative for chest tightness and shortness of breath.   Cardiovascular: Negative.  Negative for leg swelling.  Gastrointestinal: Negative.   Endocrine: Negative.   Genitourinary: Negative.   Musculoskeletal: Negative.        Objective:   Physical Exam Vitals reviewed.  Constitutional:      Appearance: Normal appearance.  HENT:     Head: Normocephalic and atraumatic.  Cardiovascular:     Rate and Rhythm: Normal rate.     Pulses: Normal pulses.  Pulmonary:     Effort: Pulmonary effort is normal.  Abdominal:     General: There is no distension.     Palpations: Abdomen is soft. There is no mass.     Tenderness: There is no abdominal tenderness.     Hernia: A hernia is present.  Musculoskeletal:        General: No swelling or deformity.  Skin:    General: Skin is warm.     Capillary Refill: Capillary refill takes less than 2 seconds.     Coloration: Skin is not jaundiced.     Findings: No bruising or lesion.  Neurological:     Mental Status: She is alert and oriented to person, place, and time.  Psychiatric:        Mood and Affect: Mood normal.        Behavior: Behavior normal.        Thought Content: Thought content normal.        Judgment: Judgment normal.       Assessment & Plan:      ICD-10-CM   1. Encounter for counseling  Z71.9        I would like her to see general surgery for evaluation of hernia and then we will do a virtual visit to discuss next steps.  Pictures were obtained of the patient and placed in the chart with the patient's or guardian's permission.

## 2022-12-12 NOTE — Addendum Note (Signed)
Addended by: Lilli Light on: 12/12/2022 03:31 PM   Modules accepted: Orders

## 2022-12-13 ENCOUNTER — Encounter (HOSPITAL_BASED_OUTPATIENT_CLINIC_OR_DEPARTMENT_OTHER): Payer: Self-pay | Admitting: Plastic Surgery

## 2022-12-13 ENCOUNTER — Encounter (HOSPITAL_BASED_OUTPATIENT_CLINIC_OR_DEPARTMENT_OTHER)
Admission: RE | Disposition: A | Discharge status: Home / Self Care | Payer: Self-pay | Source: Home / Self Care | Attending: Plastic Surgery

## 2022-12-13 ENCOUNTER — Ambulatory Visit (HOSPITAL_BASED_OUTPATIENT_CLINIC_OR_DEPARTMENT_OTHER): Payer: BC Managed Care – PPO | Admitting: Anesthesiology

## 2022-12-13 ENCOUNTER — Other Ambulatory Visit: Payer: Self-pay

## 2022-12-13 ENCOUNTER — Ambulatory Visit (HOSPITAL_BASED_OUTPATIENT_CLINIC_OR_DEPARTMENT_OTHER)
Admission: RE | Admit: 2022-12-13 | Discharge: 2022-12-13 | Disposition: A | Discharge status: Home / Self Care | Payer: BC Managed Care – PPO | Attending: Plastic Surgery | Admitting: Plastic Surgery

## 2022-12-13 DIAGNOSIS — N62 Hypertrophy of breast: Secondary | ICD-10-CM

## 2022-12-13 DIAGNOSIS — F419 Anxiety disorder, unspecified: Secondary | ICD-10-CM | POA: Diagnosis not present

## 2022-12-13 HISTORY — DX: Family history of other specified conditions: Z84.89

## 2022-12-13 HISTORY — DX: Anxiety disorder, unspecified: F41.9

## 2022-12-13 HISTORY — DX: Nausea with vomiting, unspecified: R11.2

## 2022-12-13 HISTORY — DX: Other primary ovarian failure: E28.39

## 2022-12-13 HISTORY — PX: BREAST REDUCTION SURGERY: SHX8

## 2022-12-13 SURGERY — BREAST REDUCTION WITH LIPOSUCTION
Anesthesia: General | Site: Breast | Laterality: Bilateral

## 2022-12-13 MED ORDER — ACETAMINOPHEN 325 MG RE SUPP: 650.0000 mg | RECTAL | Status: DC | PRN

## 2022-12-13 MED ORDER — LIDOCAINE 2% (20 MG/ML) 5 ML SYRINGE
INTRAMUSCULAR | Status: AC
  Filled 2022-12-13: qty 15

## 2022-12-13 MED ORDER — SODIUM CHLORIDE 0.9% FLUSH: 3.0000 mL | INTRAVENOUS | Status: DC | PRN

## 2022-12-13 MED ORDER — PHENYLEPHRINE 80 MCG/ML (10ML) SYRINGE FOR IV PUSH (FOR BLOOD PRESSURE SUPPORT)
PREFILLED_SYRINGE | INTRAVENOUS | Status: DC | PRN
  Administered 2022-12-13: 80 ug via INTRAVENOUS

## 2022-12-13 MED ORDER — AMISULPRIDE (ANTIEMETIC) 5 MG/2ML IV SOLN: 10.0000 mg | Freq: Once | INTRAVENOUS | Status: DC | PRN

## 2022-12-13 MED ORDER — PROPOFOL 500 MG/50ML IV EMUL
INTRAVENOUS | Status: AC
  Filled 2022-12-13: qty 50

## 2022-12-13 MED ORDER — SODIUM CHLORIDE 0.9% FLUSH: 3.0000 mL | Freq: Two times a day (BID) | INTRAVENOUS | Status: DC

## 2022-12-13 MED ORDER — SUGAMMADEX SODIUM 200 MG/2ML IV SOLN
INTRAVENOUS | Status: DC | PRN
  Administered 2022-12-13: 200 mg via INTRAVENOUS

## 2022-12-13 MED ORDER — CEFAZOLIN SODIUM-DEXTROSE 2-4 GM/100ML-% IV SOLN
INTRAVENOUS | Status: AC
  Filled 2022-12-13: qty 100

## 2022-12-13 MED ORDER — PROPOFOL 10 MG/ML IV BOLUS
INTRAVENOUS | Status: DC | PRN
  Administered 2022-12-13: 150 mg via INTRAVENOUS

## 2022-12-13 MED ORDER — SODIUM CHLORIDE (PF) 0.9 % IJ SOLN
INTRAMUSCULAR | Status: AC
  Filled 2022-12-13: qty 20

## 2022-12-13 MED ORDER — LIDOCAINE 2% (20 MG/ML) 5 ML SYRINGE
INTRAMUSCULAR | Status: DC | PRN
  Administered 2022-12-13: 100 mg via INTRAVENOUS

## 2022-12-13 MED ORDER — ROCURONIUM BROMIDE 10 MG/ML (PF) SYRINGE
PREFILLED_SYRINGE | INTRAVENOUS | Status: AC
  Filled 2022-12-13: qty 10

## 2022-12-13 MED ORDER — SCOPOLAMINE 1 MG/3DAYS TD PT72
1.0000 | MEDICATED_PATCH | Freq: Once | TRANSDERMAL | Status: DC
  Administered 2022-12-13: 1.5 mg via TRANSDERMAL

## 2022-12-13 MED ORDER — CEFAZOLIN SODIUM-DEXTROSE 2-4 GM/100ML-% IV SOLN
2.0000 g | INTRAVENOUS | Status: AC
  Administered 2022-12-13: 2 g via INTRAVENOUS

## 2022-12-13 MED ORDER — PHENYLEPHRINE 80 MCG/ML (10ML) SYRINGE FOR IV PUSH (FOR BLOOD PRESSURE SUPPORT)
PREFILLED_SYRINGE | INTRAVENOUS | Status: AC
  Filled 2022-12-13: qty 20

## 2022-12-13 MED ORDER — SODIUM CHLORIDE 0.9 % IV SOLN
INTRAVENOUS | Status: DC | PRN
  Administered 2022-12-13: 40 mL

## 2022-12-13 MED ORDER — FENTANYL CITRATE (PF) 100 MCG/2ML IJ SOLN
INTRAMUSCULAR | Status: AC
  Filled 2022-12-13: qty 2

## 2022-12-13 MED ORDER — HYDROMORPHONE HCL 1 MG/ML IJ SOLN: 0.2500 mg | INTRAMUSCULAR | Status: DC | PRN

## 2022-12-13 MED ORDER — MIDAZOLAM HCL 5 MG/5ML IJ SOLN
INTRAMUSCULAR | Status: DC | PRN
  Administered 2022-12-13: 2 mg via INTRAVENOUS

## 2022-12-13 MED ORDER — ACETAMINOPHEN 325 MG PO TABS: 650.0000 mg | ORAL_TABLET | ORAL | Status: DC | PRN

## 2022-12-13 MED ORDER — FENTANYL CITRATE (PF) 100 MCG/2ML IJ SOLN: 25.0000 ug | INTRAMUSCULAR | Status: DC | PRN

## 2022-12-13 MED ORDER — FENTANYL CITRATE (PF) 250 MCG/5ML IJ SOLN
INTRAMUSCULAR | Status: DC | PRN
  Administered 2022-12-13 (×4): 50 ug via INTRAVENOUS

## 2022-12-13 MED ORDER — LIDOCAINE HCL 1 % IJ SOLN
INTRAVENOUS | Status: DC | PRN
  Administered 2022-12-13: 400 mL via INTRAMUSCULAR

## 2022-12-13 MED ORDER — PROPOFOL 500 MG/50ML IV EMUL
INTRAVENOUS | Status: DC | PRN
  Administered 2022-12-13: 200 ug/kg/min via INTRAVENOUS

## 2022-12-13 MED ORDER — ONDANSETRON HCL 4 MG/2ML IJ SOLN
INTRAMUSCULAR | Status: DC | PRN
  Administered 2022-12-13: 4 mg via INTRAVENOUS

## 2022-12-13 MED ORDER — ACETAMINOPHEN 500 MG PO TABS
ORAL_TABLET | ORAL | Status: AC
  Filled 2022-12-13: qty 2

## 2022-12-13 MED ORDER — ACETAMINOPHEN 500 MG PO TABS
1000.0000 mg | ORAL_TABLET | Freq: Once | ORAL | Status: AC
  Administered 2022-12-13: 1000 mg via ORAL

## 2022-12-13 MED ORDER — LIDOCAINE-EPINEPHRINE 1 %-1:100000 IJ SOLN
INTRAMUSCULAR | Status: DC | PRN
  Administered 2022-12-13: 40 mL via INTRAMUSCULAR

## 2022-12-13 MED ORDER — CHLORHEXIDINE GLUCONATE CLOTH 2 % EX PADS: 6.0000 | MEDICATED_PAD | Freq: Once | CUTANEOUS | Status: DC

## 2022-12-13 MED ORDER — MIDAZOLAM HCL 2 MG/2ML IJ SOLN
INTRAMUSCULAR | Status: AC
  Filled 2022-12-13: qty 2

## 2022-12-13 MED ORDER — PHENYLEPHRINE HCL (PRESSORS) 10 MG/ML IV SOLN
INTRAVENOUS | Status: AC
  Filled 2022-12-13: qty 1

## 2022-12-13 MED ORDER — SCOPOLAMINE 1 MG/3DAYS TD PT72
MEDICATED_PATCH | TRANSDERMAL | Status: AC
  Filled 2022-12-13: qty 1

## 2022-12-13 MED ORDER — SODIUM CHLORIDE 0.9 % IV SOLN: 250.0000 mL | INTRAVENOUS | Status: DC | PRN

## 2022-12-13 MED ORDER — LACTATED RINGERS IV SOLN: INTRAVENOUS | Status: DC

## 2022-12-13 MED ORDER — ROCURONIUM BROMIDE 10 MG/ML (PF) SYRINGE
PREFILLED_SYRINGE | INTRAVENOUS | Status: DC | PRN
  Administered 2022-12-13: 20 mg via INTRAVENOUS
  Administered 2022-12-13: 50 mg via INTRAVENOUS

## 2022-12-13 MED ORDER — DEXAMETHASONE SODIUM PHOSPHATE 10 MG/ML IJ SOLN
INTRAMUSCULAR | Status: DC | PRN
  Administered 2022-12-13: 4 mg via INTRAVENOUS

## 2022-12-13 MED ORDER — OXYCODONE HCL 5 MG PO TABS
ORAL_TABLET | ORAL | Status: AC
  Filled 2022-12-13: qty 1

## 2022-12-13 MED ORDER — BUPIVACAINE LIPOSOME 1.3 % IJ SUSP
INTRAMUSCULAR | Status: AC
  Filled 2022-12-13: qty 20

## 2022-12-13 MED ORDER — OXYCODONE HCL 5 MG PO TABS
5.0000 mg | ORAL_TABLET | ORAL | Status: DC | PRN
  Administered 2022-12-13: 5 mg via ORAL

## 2022-12-13 SURGICAL SUPPLY — 62 items
ADH SKN CLS APL DERMABOND .7 (GAUZE/BANDAGES/DRESSINGS) ×2
BAG DECANTER FOR FLEXI CONT (MISCELLANEOUS) ×1 IMPLANT
BINDER BREAST LRG (GAUZE/BANDAGES/DRESSINGS) IMPLANT
BINDER BREAST MEDIUM (GAUZE/BANDAGES/DRESSINGS) IMPLANT
BINDER BREAST XLRG (GAUZE/BANDAGES/DRESSINGS) IMPLANT
BINDER BREAST XXLRG (GAUZE/BANDAGES/DRESSINGS) IMPLANT
BIOPATCH RED 1 DISK 7.0 (GAUZE/BANDAGES/DRESSINGS) IMPLANT
BLADE HEX COATED 2.75 (ELECTRODE) IMPLANT
BLADE KNIFE PERSONA 10 (BLADE) ×2 IMPLANT
BLADE SURG 15 STRL LF DISP TIS (BLADE) ×1 IMPLANT
BLADE SURG 15 STRL SS (BLADE) ×1
CANISTER SUCT 1200ML W/VALVE (MISCELLANEOUS) ×1 IMPLANT
COVER BACK TABLE 60X90IN (DRAPES) ×1 IMPLANT
COVER MAYO STAND STRL (DRAPES) ×1 IMPLANT
DERMABOND ADVANCED .7 DNX12 (GAUZE/BANDAGES/DRESSINGS) ×2 IMPLANT
DRAIN CHANNEL 19F RND (DRAIN) IMPLANT
DRAPE LAPAROSCOPIC ABDOMINAL (DRAPES) ×1 IMPLANT
DRSG MEPILEX POST OP 4X8 (GAUZE/BANDAGES/DRESSINGS) ×2 IMPLANT
ELECT BLADE 4.0 EZ CLEAN MEGAD (MISCELLANEOUS) ×1
ELECT REM PT RETURN 9FT ADLT (ELECTROSURGICAL) ×1
ELECTRODE BLDE 4.0 EZ CLN MEGD (MISCELLANEOUS) ×1 IMPLANT
ELECTRODE REM PT RTRN 9FT ADLT (ELECTROSURGICAL) ×1 IMPLANT
EVACUATOR SILICONE 100CC (DRAIN) IMPLANT
GAUZE PAD ABD 8X10 STRL (GAUZE/BANDAGES/DRESSINGS) ×2 IMPLANT
GLOVE BIO SURGEON STRL SZ 6.5 (GLOVE) ×3 IMPLANT
GLOVE BIO SURGEON STRL SZ7.5 (GLOVE) ×1 IMPLANT
GOWN STRL REUS W/ TWL LRG LVL3 (GOWN DISPOSABLE) ×2 IMPLANT
GOWN STRL REUS W/ TWL XL LVL3 (GOWN DISPOSABLE) ×1 IMPLANT
GOWN STRL REUS W/TWL LRG LVL3 (GOWN DISPOSABLE) ×4
GOWN STRL REUS W/TWL XL LVL3 (GOWN DISPOSABLE) ×1
NDL FILTER BLUNT 18X1 1/2 (NEEDLE) IMPLANT
NDL HYPO 25X1 1.5 SAFETY (NEEDLE) ×1 IMPLANT
NEEDLE FILTER BLUNT 18X1 1/2 (NEEDLE) ×1 IMPLANT
NEEDLE HYPO 25X1 1.5 SAFETY (NEEDLE) ×1 IMPLANT
NS IRRIG 1000ML POUR BTL (IV SOLUTION) ×1 IMPLANT
PACK BASIN DAY SURGERY FS (CUSTOM PROCEDURE TRAY) ×1 IMPLANT
PAD ALCOHOL SWAB (MISCELLANEOUS) IMPLANT
PAD FOAM SILICONE BACKED (GAUZE/BANDAGES/DRESSINGS) IMPLANT
PENCIL SMOKE EVACUATOR (MISCELLANEOUS) ×1 IMPLANT
PIN SAFETY STERILE (MISCELLANEOUS) IMPLANT
SLEEVE SCD COMPRESS KNEE MED (STOCKING) ×1 IMPLANT
SPIKE FLUID TRANSFER (MISCELLANEOUS) IMPLANT
SPONGE T-LAP 18X18 ~~LOC~~+RFID (SPONGE) ×2 IMPLANT
STRIP SUTURE WOUND CLOSURE 1/2 (MISCELLANEOUS) ×2 IMPLANT
SUT MNCRL AB 4-0 PS2 18 (SUTURE) ×4 IMPLANT
SUT MON AB 3-0 SH 27 (SUTURE) ×6
SUT MON AB 3-0 SH27 (SUTURE) ×4 IMPLANT
SUT MON AB 5-0 PS2 18 (SUTURE) IMPLANT
SUT PDS 3-0 CT2 (SUTURE) ×4
SUT PDS II 3-0 CT2 27 ABS (SUTURE) ×4 IMPLANT
SUT SILK 3 0 PS 1 (SUTURE) IMPLANT
SYR 50ML LL SCALE MARK (SYRINGE) IMPLANT
SYR BULB IRRIG 60ML STRL (SYRINGE) ×1 IMPLANT
SYR CONTROL 10ML LL (SYRINGE) ×1 IMPLANT
TAPE MEASURE VINYL STERILE (MISCELLANEOUS) IMPLANT
TOWEL GREEN STERILE FF (TOWEL DISPOSABLE) ×2 IMPLANT
TRAY DSU PREP LF (CUSTOM PROCEDURE TRAY) ×1 IMPLANT
TUBE CONNECTING 20X1/4 (TUBING) ×1 IMPLANT
TUBING INFILTRATION IT-10001 (TUBING) IMPLANT
TUBING SET GRADUATE ASPIR 12FT (MISCELLANEOUS) IMPLANT
UNDERPAD 30X36 HEAVY ABSORB (UNDERPADS AND DIAPERS) ×2 IMPLANT
YANKAUER SUCT BULB TIP NO VENT (SUCTIONS) ×1 IMPLANT

## 2022-12-13 NOTE — Anesthesia Postprocedure Evaluation (Signed)
Anesthesia Post Note  Patient: Marisa Harris  Procedure(s) Performed: BREAST REDUCTION WITH LIPOSUCTION (Bilateral: Breast)     Patient location during evaluation: PACU Anesthesia Type: General Level of consciousness: awake and alert and oriented Pain management: pain level controlled Vital Signs Assessment: post-procedure vital signs reviewed and stable Respiratory status: spontaneous breathing, nonlabored ventilation and respiratory function stable Cardiovascular status: blood pressure returned to baseline and stable Postop Assessment: no apparent nausea or vomiting Anesthetic complications: no   No notable events documented.  Last Vitals:  Vitals:   12/13/22 1215 12/13/22 1230  BP: 111/63 111/64  Pulse: 70 71  Resp: 13 13  Temp:    SpO2: 99% 94%    Last Pain:  Vitals:   12/13/22 0838  TempSrc: Oral  PainSc: 0-No pain                 Marisa Harris A.

## 2022-12-13 NOTE — Interval H&P Note (Signed)
History and Physical Interval Note:  12/13/2022 9:44 AM  Marisa Harris  has presented today for surgery, with the diagnosis of Macromastia.  The various methods of treatment have been discussed with the patient and family. After consideration of risks, benefits and other options for treatment, the patient has consented to  Procedure(s): BREAST REDUCTION WITH LIPOSUCTION (Bilateral) as a surgical intervention.  The patient's history has been reviewed, patient examined, no change in status, stable for surgery.  I have reviewed the patient's chart and labs.  Questions were answered to the patient's satisfaction.     [name]

## 2022-12-13 NOTE — Anesthesia Preprocedure Evaluation (Addendum)
Anesthesia Evaluation  Patient identified by MRN, date of birth, ID band Patient awake    Reviewed: Allergy & Precautions, NPO status , Patient's Chart, lab work & pertinent test results  History of Anesthesia Complications (+) PONV and history of anesthetic complications  Airway Mallampati: II  TM Distance: >3 FB Neck ROM: Full    Dental no notable dental hx.    Pulmonary neg pulmonary ROS   Pulmonary exam normal        Cardiovascular negative cardio ROS Normal cardiovascular exam     Neuro/Psych   Anxiety        GI/Hepatic negative GI ROS, Neg liver ROS,,,  Endo/Other  negative endocrine ROS    Renal/GU negative Renal ROS     Musculoskeletal negative musculoskeletal ROS (+)    Abdominal   Peds  Hematology negative hematology ROS (+)   Anesthesia Other Findings Macromastia  Reproductive/Obstetrics                             Anesthesia Physical Anesthesia Plan  ASA: 2  Anesthesia Plan: General   Post-op Pain Management: Tylenol PO (pre-op)*   Induction: Intravenous  PONV Risk Score and Plan: 4 or greater and Treatment may vary due to age or medical condition, Ondansetron, Dexamethasone, Midazolam, Scopolamine patch - Pre-op, Propofol infusion and TIVA  Airway Management Planned: Oral ETT  Additional Equipment: None  Intra-op Plan:   Post-operative Plan: Extubation in OR  Informed Consent: I have reviewed the patients History and Physical, chart, labs and discussed the procedure including the risks, benefits and alternatives for the proposed anesthesia with the patient or authorized representative who has indicated his/her understanding and acceptance.     Dental advisory given  Plan Discussed with: CRNA  Anesthesia Plan Comments:        Anesthesia Quick Evaluation

## 2022-12-13 NOTE — Discharge Instructions (Addendum)
INSTRUCTIONS FOR AFTER BREAST SURGERY   You will likely have some questions about what to expect following your operation.  The following information will help you and your family understand what to expect when you are discharged from the hospital.  It is important to follow these guidelines to help ensure a smooth recovery and reduce complication.  Postoperative instructions include information on: diet, wound care, medications and physical activity.  AFTER SURGERY Expect to go home after the procedure.  In some cases, you may need to spend one night in the hospital for observation.  DIET Breast surgery does not require a specific diet.  However, the healthier you eat the better your body will heal. It is important to increasing your protein intake.  This means limiting the foods with sugar and carbohydrates.  Focus on vegetables and some meat.  If you have liposuction during your procedure be sure to drink water.  If your urine is bright yellow, then it is concentrated, and you need to drink more water.  As a general rule after surgery, you should have 8 ounces of water every hour while awake.  If you find you are persistently nauseated or unable to take in liquids let us know.  NO TOBACCO USE or EXPOSURE.  This will slow your healing process and lead to a wound.  WOUND CARE Leave the binder on for 3 days . Use fragrance free soap like Dial, Dove or [place].   After 3 days you can remove the binder to shower. Once dry apply binder or sports bra. If you have liposuction you will have a soft and spongy dressing (Lipofoam) that helps prevent creases in your skin.  Remove before you shower and then replace it.  It is also available on [employer]. If you have steri-strips / tape directly attached to your skin leave them in place. It is OK to get these wet.   No baths, pools or hot tubs for four weeks. We close your incision to leave the smallest and best-looking scar. No ointment or creams on your incisions  for four weeks.  No Neosporin (Too many skin reactions).  A few weeks after surgery you can use Mederma and start massaging the scar. We ask you to wear your binder or sports bra for the first 6 weeks around the clock, including while sleeping. This provides added comfort and helps reduce the fluid accumulation at the surgery site. NO Ice or heating pads to the operative site.  You have a very high risk of a BURN before you feel the temperature change.  ACTIVITY No heavy lifting until cleared by the doctor.  This usually means no more than a half-gallon of milk.  It is OK to walk and climb stairs. Moving your legs is very important to decrease your risk of a blood clot.  It will also help keep you from getting deconditioned.  Every 1 to 2 hours get up and walk for 5 minutes. This will help with a quicker recovery back to normal.  Let pain be your guide so you don't do too much.  This time is for you to recover.  You will be more comfortable if you sleep and rest with your head elevated either with a few pillows under you or in a recliner.  No stomach sleeping for a three months.  WORK Everyone returns to work at different times. As a rough guide, most people take at least 1 - 2 weeks off prior to returning to work. If  you need documentation for your job, give the forms to the front staff at the clinic.  DRIVING Arrange for someone to bring you home from the hospital after your surgery.  You may be able to drive a few days after surgery but not while taking any narcotics or valium.  BOWEL MOVEMENTS Constipation can occur after anesthesia and while taking pain medication.  It is important to stay ahead for your comfort.  We recommend taking Milk of Magnesia (2 tablespoons; twice a day) while taking the pain pills.  MEDICATIONS You may be prescribed should start after surgery At your preoperative visit for you history and physical you may have been given the following medications: An antibiotic: Start  this medication when you get home and take according to the instructions on the bottle. Zofran 4 mg:  This is to treat nausea and vomiting.  You can take this every 6 hours as needed and only if needed. Valium 2 mg for breast cancer patients: This is for muscle tightness if you have an implant or expander. This will help relax your muscle which also helps with pain control.  This can be taken every 12 hours as needed. Don't drive after taking this medication. Norco (hydrocodone/acetaminophen) 5/325 mg:  This is only to be used after you have taken the Motrin or the Tylenol. Every 8 hours as needed.   Over the counter Medication to take: Ibuprofen (Motrin) 600 mg:  Take this every 6 hours.  If you have additional pain then take 500 mg of the Tylenol every 8 hours.  Only take the Norco after you have tried these two. No Tylenol until after 2:45pm today. MiraLAX or Milk of Magnesia: Take this according to the bottle if you take the [place] Call your surgeon's office if any of the following occur: Fever 101 degrees F or greater Excessive bleeding or fluid from the incision site. Pain that increases over time without aid from the medications Redness, warmth, or pus draining from incision sites Persistent nausea or inability to take in liquids Severe misshapen area that underwent the operation.  Post Anesthesia Home Care Instructions  Activity: Get plenty of rest for the remainder of the day. A responsible individual must stay with you for 24 hours following the procedure.  For the next 24 hours, DO NOT: -Drive a car -Paediatric nurse -Drink alcoholic beverages -Take any medication unless instructed by your physician -Make any legal decisions or sign important papers.  Meals: Start with liquid foods such as gelatin or soup. Progress to regular foods as tolerated. Avoid greasy, spicy, heavy foods. If nausea and/or vomiting occur, drink only clear liquids until the nausea and/or  vomiting subsides. Call your physician if vomiting continues.  Special Instructions/Symptoms: Your throat may feel dry or sore from the anesthesia or the breathing tube placed in your throat during surgery. If this causes discomfort, gargle with warm salt water. The discomfort should disappear within 24 hours.  If you had a scopolamine patch placed behind your ear for the management of post- operative nausea and/or vomiting:  1. The medication in the patch is effective for 72 hours, after which it should be removed.  Wrap patch in a tissue and discard in the trash. Wash hands thoroughly with soap and water. 2. You may remove the patch earlier than 72 hours if you experience unpleasant side effects which may include dry mouth, dizziness or visual disturbances. 3. Avoid touching the patch. Wash your hands with soap and  water after contact with the patch.   Information for Discharge Teaching: EXPAREL (bupivacaine liposome injectable suspension)   Your surgeon or anesthesiologist gave you EXPAREL(bupivacaine) to help control your pain after surgery.  EXPAREL is a local anesthetic that provides pain relief by numbing the tissue around the surgical site. EXPAREL is designed to release pain medication over time and can control pain for up to 72 hours. Depending on how you respond to EXPAREL, you may require less pain medication during your recovery.  Possible side effects: Temporary loss of sensation or ability to move in the area where bupivacaine was injected. Nausea, vomiting, constipation Rarely, numbness and tingling in your mouth or lips, lightheadedness, or anxiety may occur. Call your doctor right away if you think you may be experiencing any of these sensations, or if you have other questions regarding possible side effects.  Follow all other discharge instructions given to you by your surgeon or nurse. Eat a healthy diet and drink plenty of water or other fluids.  If you return to the  hospital for any reason within 96 hours following the administration of EXPAREL, it is important for health care providers to know that you have received this anesthetic. A teal colored band has been placed on your arm with the date, time and amount of EXPAREL you have received in order to alert and inform your health care providers. Please leave this armband in place for the full 96 hours following administration, and then you may remove the band.

## 2022-12-13 NOTE — Transfer of Care (Signed)
Immediate Anesthesia Transfer of Care Note  Patient: Marisa Harris  Procedure(s) Performed: BREAST REDUCTION WITH LIPOSUCTION (Bilateral: Breast)  Patient Location: PACU  Anesthesia Type:General  Level of Consciousness: awake, oriented, and patient cooperative  Airway & Oxygen Therapy: Patient Spontanous Breathing and Patient connected to face mask oxygen  Post-op Assessment: Report given to RN and Post -op Vital signs reviewed and stable  Post vital signs: Reviewed and stable  Last Vitals:  Vitals Value Taken Time  BP 119/75   Temp    Pulse 79 12/13/22 1203  Resp 14   SpO2 100 % 12/13/22 1203  Vitals shown include unvalidated device data.  Last Pain:  Vitals:   12/13/22 0838  TempSrc: Oral  PainSc: 0-No pain      Patients Stated Pain Goal: 3 (71/21/97 5883)  Complications: No notable events documented.

## 2022-12-13 NOTE — Op Note (Addendum)
Breast Reduction Op note:    DATE OF PROCEDURE: 12/13/2022  LOCATION: [facility]  SURGEON: Lyndee Leo Sanger Kaeson Kleinert, DO  ASSISTANT: Roetta Sessions, PA  PREOPERATIVE DIAGNOSIS 1. Macromastia 2. Neck Pain 3. Back Pain  POSTOPERATIVE DIAGNOSIS 1. Macromastia 2. Neck Pain 3. Back Pain  PROCEDURES 1. Bilateral breast reduction.  Right reduction 501 g, Left reduction 518 g  COMPLICATIONS: None.  DRAINS: none  INDICATIONS FOR PROCEDURE Marisa Harris is a 52 y.o. year-old female born on 11/28/1970,with a history of symptomatic macromastia with concominant back pain, neck pain, shoulder grooving from her bra.   MRN: [id number]  CONSENT Informed consent was obtained directly from the patient. The risks, benefits and alternatives were fully discussed. Specific risks including but not limited to bleeding, infection, hematoma, seroma, scarring, pain, nipple necrosis, asymmetry, poor cosmetic results, and need for further surgery were discussed. The patient's questions were answered.  DESCRIPTION OF PROCEDURE  Patient was brought into the operating room and rested on the operating room table in the supine position.  SCDs were placed and appropriate padding was performed.  Antibiotics were given. The patient underwent general anesthesia and the chest was prepped and draped in a sterile fashion.  A timeout was performed and all information was confirmed to be correct by those in the room. Tumescent was placed in the lateral breast.  Liposuction was done in the lateral breast on each side for improved contour.  Right side: Preoperative markings were confirmed.  Incision lines were injected with local containing epinephrine.  After waiting for vasoconstriction, the marked lines were incised with a #15 blade.  A Wise-pattern superomedial breast reduction was performed by de-epithelializing the pedicle, using bovie to create the superomedial pedicle, and removing breast  tissue from the lateral and inferior portions of the breast.  Care was taken to not undermine the breast pedicle. Hemostasis was achieved.  Experel was placed. The nipple was gently rotated into position and the soft tissue closed with 4-0 Monocryl.   The pocket was irrigated and hemostasis confirmed.  The deep tissues were approximated with 3-0 PDS sutures.  The skin was closed with deep dermal 3-0 Monocryl and subcuticular 4-0 Monocryl sutures.  The nipple and skin flaps had good capillary refill at the end of the procedure.    Left side: Preoperative markings were confirmed.  Incision lines were injected with local containing epinephrine.  After waiting for vasoconstriction, the marked lines were incised with a #15 blade.  A Wise-pattern superomedial breast reduction was performed by de-epithelializing the pedicle, using bovie to create the superomedial pedicle, and removing breast tissue from the lateral and inferior portions of the breast.  Care was taken to not undermine the breast pedicle. Hemostasis was achieved. Experel was placed. The nipple was gently rotated into position and the soft tissue was closed with 4-0 Monocryl.  The patient was sat upright and size and shape symmetry was confirmed.  The pocket was irrigated and hemostasis confirmed.  The deep tissues were approximated with 3-0 PDS sutures. The skin was closed with deep dermal 3-0 Monocryl and subcuticular 4-0 Monocryl sutures.  Dermabond was applied.  A breast binder and ABDs were placed.  The nipple and skin flaps had good capillary refill at the end of the procedure.  The patient tolerated the procedure well. The patient was allowed to wake from anesthesia and taken to the recovery room in satisfactory condition.  The advanced practice practitioner (APP) assisted throughout the case.  The APP was essential  in retraction and counter traction when needed to make the case progress smoothly.  This retraction and assistance made it possible to  see the tissue plans for the procedure.  The assistance was needed for blood control, tissue re-approximation and assisted with closure of the incision site.

## 2022-12-13 NOTE — Addendum Note (Signed)
Addendum  created 12/13/22 1316 by Lowella Dell, CRNA   Intraprocedure Event edited, Intraprocedure Meds edited

## 2022-12-13 NOTE — Anesthesia Procedure Notes (Signed)
Procedure Name: Intubation Date/Time: 12/13/2022 10:33 AM  Performed by: Lowella Dell, CRNAPre-anesthesia Checklist: Patient identified, Emergency Drugs available, Suction available and Patient being monitored Patient Re-evaluated:Patient Re-evaluated prior to induction Oxygen Delivery Method: Circle System Utilized Preoxygenation: Pre-oxygenation with 100% oxygen Induction Type: IV induction Ventilation: Mask ventilation without difficulty Laryngoscope Size: Mac and 3 Grade View: Grade I Tube type: Oral Tube size: 7.0 mm Number of attempts: 1 Airway Equipment and Method: Stylet and Oral airway Placement Confirmation: ETT inserted through vocal cords under direct vision, positive ETCO2 and breath sounds checked- equal and bilateral Secured at: 22 cm Tube secured with: Tape Dental Injury: Teeth and Oropharynx as per pre-operative assessment

## 2022-12-14 ENCOUNTER — Encounter (HOSPITAL_BASED_OUTPATIENT_CLINIC_OR_DEPARTMENT_OTHER): Payer: Self-pay | Admitting: Plastic Surgery

## 2022-12-14 ENCOUNTER — Telehealth: Payer: BC Managed Care – PPO | Admitting: Physician Assistant

## 2022-12-14 LAB — SURGICAL PATHOLOGY

## 2022-12-22 ENCOUNTER — Encounter: Payer: Self-pay | Admitting: Surgical

## 2022-12-22 ENCOUNTER — Ambulatory Visit (INDEPENDENT_AMBULATORY_CARE_PROVIDER_SITE_OTHER): Payer: BC Managed Care – PPO | Admitting: Surgical

## 2022-12-22 VITALS — BP 120/76 | HR 77

## 2022-12-22 DIAGNOSIS — N62 Hypertrophy of breast: Secondary | ICD-10-CM

## 2022-12-22 NOTE — Progress Notes (Signed)
52 year old female here for follow-up after bilateral breast reduction with Dr. Marla Roe on 12/13/2022.  She is just over 1 week postop.  She is doing really well, has not had any infectious symptoms or concerns.  She has been wearing compressive garments.  She has some questions about sleeping as well as a small abrasion on her left upper back.  Chaperone present on exam Bilateral NAC's are viable, bilateral breast incisions are intact and appear to be healing well.  Steri-Strips in place.  She does have some mild subcutaneous fluid collection noted palpation, left greater than right.  There is no erythema or cellulitic changes of either breast.  She does have some mild ecchymosis noted of breast.  There is more ecchymosis of the right lateral breast where liposuction was performed extending down her right flank.  No subcutaneous fluid collection/hematoma noted in this area.  On her left back she does have a superficial abrasion that is approximately 5 x 5 mm, no surrounding erythema or cellulitic changes.  Unclear etiology.  A/P:  Continue with compressive garments, avoid stress activities or heavy lifting.  We discussed needle aspiration of bilateral breast today in office or continued watchful waiting and compression, patient would like to continue with compression and reconsider aspiration at her next appointment.  I think this is reasonable given minimal tightness on the incisions.   Continue with Vaseline to the left upper back wound, unsure etiology.  It appears to be a superficial burn versus an abrasion.  No signs of infection on exam, call with questions or concerns.  We will see her back in 2 weeks for reevaluation.

## 2022-12-26 ENCOUNTER — Ambulatory Visit: Payer: Self-pay | Admitting: Surgery

## 2023-01-02 ENCOUNTER — Telehealth (INDEPENDENT_AMBULATORY_CARE_PROVIDER_SITE_OTHER): Payer: BC Managed Care – PPO | Admitting: Plastic Surgery

## 2023-01-02 DIAGNOSIS — Z719 Counseling, unspecified: Secondary | ICD-10-CM

## 2023-01-02 DIAGNOSIS — N62 Hypertrophy of breast: Secondary | ICD-10-CM

## 2023-01-02 NOTE — Progress Notes (Signed)
   Subjective:    Patient ID: Marisa Harris, female    DOB: Feb 15, 1971, 52 y.o.   MRN: [id number]  The patient is a 52 year old female joining me by video for further discussion about her abdomen.  She did see Dr. Johney Maine last week and a hernia around the umbilical area was identified.  She would still like to move forward with abdominal surgery at the same time as the repair.  She will probably be a good candidate for a mini abdominoplasty.      Review of Systems  Constitutional: Negative.   HENT: Negative.    Eyes: Negative.   Respiratory: Negative.    Cardiovascular: Negative.   Gastrointestinal: Negative.   Endocrine: Negative.   Genitourinary: Negative.   Musculoskeletal: Negative.        Objective:   Physical Exam      Assessment & Plan:  No diagnosis found.   I connected with  Marisa Harris on 01/02/23 by a video enabled telemedicine application and verified that I am speaking with the correct person using two identifiers. We spent 5 minutes in decision.    I discussed the limitations of evaluation and management by telemedicine. The patient expressed understanding and agreed to proceed.  I will talk with Dr. Johney Maine and we can coordinate doing the surgery together.  Will get the patient back in for further discussion and her history and physical.

## 2023-01-05 ENCOUNTER — Encounter: Payer: Self-pay | Admitting: Plastic Surgery

## 2023-01-05 ENCOUNTER — Ambulatory Visit (INDEPENDENT_AMBULATORY_CARE_PROVIDER_SITE_OTHER): Payer: BC Managed Care – PPO | Admitting: Plastic Surgery

## 2023-01-05 VITALS — BP 108/75 | HR 93

## 2023-01-05 DIAGNOSIS — N62 Hypertrophy of breast: Secondary | ICD-10-CM

## 2023-01-05 NOTE — Progress Notes (Signed)
   Subjective:    Patient ID: Marisa Harris, female    DOB: Nov 01, 1971, 52 y.o.   MRN: [id number]  The patient is a 52 year old female here for follow-up after undergoing breast surgery on January 24.  She had 500 g removed from each breast.  She still has a little bit of swelling but overall she is doing really well.  No sign of seroma or hematoma.  Steri-Strips are still in place.  No sign of infection.  Continue with the breast binder or sports bra.      Review of Systems  Constitutional: Negative.   Eyes: Negative.   Respiratory: Negative.    Cardiovascular: Negative.   Gastrointestinal: Negative.   Endocrine: Negative.   Genitourinary: Negative.        Objective:   Physical Exam Constitutional:      Appearance: Normal appearance.  Cardiovascular:     Rate and Rhythm: Normal rate.     Pulses: Normal pulses.  Neurological:     Mental Status: She is alert and oriented to person, place, and time.  Psychiatric:        Mood and Affect: Mood normal.        Behavior: Behavior normal.        Thought Content: Thought content normal.           Assessment & Plan:     ICD-10-CM   1. Symptomatic mammary hypertrophy  N62       Pictures were obtained of the patient and placed in the chart with the patient's or guardian's permission.  Increase activity slowly.

## 2023-01-09 ENCOUNTER — Encounter: Payer: Self-pay | Admitting: *Deleted

## 2023-01-19 ENCOUNTER — Ambulatory Visit (INDEPENDENT_AMBULATORY_CARE_PROVIDER_SITE_OTHER): Payer: BC Managed Care – PPO | Admitting: Surgical

## 2023-01-19 DIAGNOSIS — Z9889 Other specified postprocedural states: Secondary | ICD-10-CM

## 2023-01-19 DIAGNOSIS — N62 Hypertrophy of breast: Secondary | ICD-10-CM

## 2023-01-19 NOTE — Progress Notes (Signed)
52 year old female here for follow-up after bilateral breast reduction with Dr. Marla Roe on 12/13/2022.  She is just over 5 weeks postop.  She reports that she is doing really well.  She is not having any issues at this time.  She does have her Steri-Strips still in place and would like these removed.  Chaperone present on exam On exam bilateral NAC's are viable, bilateral breast incisions are intact and healing well, CDI.  Steri-Strips in place, these were removed.  No subcutaneous fluid collection noted palpation.  There is no erythema or cellulitic changes of either breast.  She does have some small clear fluid-filled blisters that are approximately 1 mm each or less along the superior vertical limb of both breasts.  There is no surrounding erythema or cellulitic changes.  There is no tenderness noted with palpation of either breast.  A/P:  Steri-Strips were removed, recommend Vaseline to bilateral breast along the inframammary fold where blisters were present.  Unsure of etiology, however does not appear worrisome for infection or concern at this time, recommend continue to monitor.  Recommend continuing with compressive garments, avoiding strenuous activity or heavy lifting for 1 more week, recommend starting scar cream in a few days.  After 6 weeks postop, restrictions no longer present and she can begin lifting as tolerated and slowly increase activity.  Pictures were obtained of the patient and placed in the chart with the patient's or guardian's permission.  Follow-up as needed.  Call with questions or concerns.

## 2023-02-07 DIAGNOSIS — Z719 Counseling, unspecified: Secondary | ICD-10-CM

## 2023-03-12 ENCOUNTER — Ambulatory Visit (INDEPENDENT_AMBULATORY_CARE_PROVIDER_SITE_OTHER): Payer: Self-pay | Admitting: Physician Assistant

## 2023-03-12 VITALS — BP 114/75 | HR 69 | Resp 16 | Ht 67.0 in

## 2023-03-12 DIAGNOSIS — K429 Umbilical hernia without obstruction or gangrene: Secondary | ICD-10-CM

## 2023-03-12 MED ORDER — CEPHALEXIN 500 MG PO CAPS: 500.0000 mg | ORAL_CAPSULE | Freq: Four times a day (QID) | ORAL | 0 refills | Status: AC

## 2023-03-12 MED ORDER — ONDANSETRON 4 MG PO TBDP: 4.0000 mg | ORAL_TABLET | Freq: Three times a day (TID) | ORAL | 0 refills | Status: AC | PRN

## 2023-03-12 MED ORDER — OXYCODONE HCL 5 MG PO TABS: 5.0000 mg | ORAL_TABLET | Freq: Four times a day (QID) | ORAL | 0 refills | Status: AC | PRN

## 2023-03-12 NOTE — Progress Notes (Signed)
Patient ID: Marisa Harris, female    DOB: [birth year], 52 y.o.   MRN: [id number]  Chief Complaint  Patient presents with   Pre-op Exam      ICD-10-CM   1. Umbilical hernia without obstruction and without gangrene  K42.9        History of Present Illness: Marisa Harris is a 52 y.o.  female  with a history of umbilical hernia.  She presents for preoperative evaluation for upcoming procedure, mini abdominoplasty with liposuction at time of hernia repair, scheduled for 03/21/2023 with Dr. Ulice Bold.  The patient has not had problems with anesthesia.  Recent breast reduction without complication from anesthesia.  She sees a Scientist, physiological in [place].  She understands that she will need to hold her vitamins and supplements 7 days prior to surgery.  She is currently doing a GLP-1 agonist injectable once weekly and will hold 1 week prior to surgery.  She applies a transdermal ointment involving progesterone, estrogen, and testosterone for vasomotor symptoms.  She plans to hold this, as well, but understands that it is a much lower risk (if any) for postoperative thrombosis relative to oral hormone therapy.  She denies any personal or family history of blood clots or clotting disorder.  Denies any personal history of cancer, varicosities, or any severe cardiac or pulmonary disease.  She did report constipation postoperatively from last surgery.  Discussed Dulcolax daily as well as increased oral hydration and ambulation postoperatively.  MiraLAX or magnesium citrate as needed if still unimproved.  Summary of Previous Visit: Patient was seen via video visit by Dr. Ulice Bold 01/02/2023.  At that time, discussed mini abdominoplasty at time of her umbilical hernia to be performed by Dr. Michaell Cowing.  Patient was agreeable.  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  Discussed 1 to 2 weeks.  PMH Significant for: Umbilical hernia, breast  reduction surgery.     Past Medical History: Allergies: Allergies  Allergen Reactions   Codeine Nausea Only and Nausea And Vomiting    Current Medications:  Current Outpatient Medications:    cephALEXin (KEFLEX) 500 MG capsule, Take 1 capsule (500 mg total) by mouth 4 (four) times daily for 3 days., Disp: 12 capsule, Rfl: 0   escitalopram (LEXAPRO) 10 MG tablet, Take 10 mg by mouth daily., Disp: , Rfl:    magnesium oxide (MAG-OX) 400 (240 Mg) MG tablet, Take 400 mg by mouth daily., Disp: , Rfl:    Multiple Vitamin (MULTIVITAMIN) tablet, Take 1 tablet by mouth daily., Disp: , Rfl:    ondansetron (ZOFRAN-ODT) 4 MG disintegrating tablet, Take 1 tablet (4 mg total) by mouth every 8 (eight) hours as needed for nausea or vomiting., Disp: 20 tablet, Rfl: 0   oxyCODONE (ROXICODONE) 5 MG immediate release tablet, Take 1 tablet (5 mg total) by mouth every 6 (six) hours as needed for up to 5 days for severe pain., Disp: 20 tablet, Rfl: 0  Past Medical Problems: Past Medical History:  Diagnosis Date   Anxiety    Family history of adverse reaction to anesthesia    mother and maternal grandmother with n/v   PONV (postoperative nausea and vomiting)    Premature ovarian failure    Staph infection    left inner leg 2013    Past Surgical History: Past Surgical History:  Procedure Laterality Date   BREAST REDUCTION SURGERY Bilateral 12/13/2022   Procedure: BREAST REDUCTION WITH LIPOSUCTION;  Surgeon: Ulice Bold,  Alena Bills, DO;  Location: [facility];  Service: Plastics;  Laterality: Bilateral;   EYE SURGERY     KNEE SURGERY  11/20/1990   right    Social History: Social History   Socioeconomic History   Marital status: Married    Spouse name: Not on file   Number of children: Not on file   Years of education: Not on file   Highest education level: Not on file  Occupational History   Not on file  Tobacco Use   Smoking status: Never   Smokeless tobacco: Never  Vaping Use    Vaping Use: Never used  Substance and Sexual Activity   Alcohol use: Yes    Comment: monthly   Drug use: No   Sexual activity: Not on file  Other Topics Concern   Not on file  Social History Narrative   Not on file   Social Determinants of Health   Financial Resource Strain: Not on file  Food Insecurity: Not on file  Transportation Needs: Not on file  Physical Activity: Not on file  Stress: Not on file  Social Connections: Not on file  Intimate Partner Violence: Not on file    Family History: No family history on file.  Review of Systems: ROS Denies any recent chest pain, difficulty breathing, leg swelling, or fevers.  Physical Exam: Vital Signs BP 114/75 (BP Location: Left Arm, Patient Position: Sitting, Cuff Size: Normal)   Pulse 69   Resp 16   Ht  (1.702 m)   SpO2 98%   BMI 25.69 kg/m   Physical Exam Constitutional:      General: Not in acute distress.    Appearance: Normal appearance. Not ill-appearing.  HENT:     Head: Normocephalic and atraumatic.  Eyes:     Pupils: Pupils are equal, round. Cardiovascular:     Rate and Rhythm: Normal rate.    Pulses: Normal pulses.  Pulmonary:     Effort: No respiratory distress or increased work of breathing.  Speaks in full sentences. Abdominal:     General: Abdomen is flat. No distension.   Musculoskeletal: Normal range of motion. No lower extremity swelling or edema. No varicosities. Skin:    General: Skin is warm and dry.     Findings: No erythema or rash.  Neurological:     Mental Status: Alert and oriented to person, place, and time.  Psychiatric:        Mood and Affect: Mood normal.        Behavior: Behavior normal.    Assessment/Plan: The patient is scheduled for mini abdominoplasty with liposuction at time of umbilical hernia repair with Dr. Ulice Bold.  Risks, benefits, and alternatives of procedure discussed, questions answered and consent obtained.    Smoking Status: Non-smoker.  Caprini  Score: 3; Risk Factors include: Age and length of planned surgery. Recommendation for mechanical prophylaxis. Encourage early ambulation.   Pictures obtained: 12/12/2022  Post-op Rx sent to pharmacy: Keflex, oxycodone, Zofran.  Patient was provided with the General Surgical Risk consent document and Pain Medication Agreement prior to their appointment.  They had adequate time to read through the risk consent documents and Pain Medication Agreement. We also discussed them in person together during this preop appointment. All of their questions were answered to their satisfaction.  Recommended calling if they have any further questions.  Risk consent form and Pain Medication Agreement to be scanned into patient's chart.  The risk that can be encountered for this procedure  were discussed and include the following but not limited to these: asymmetry, fluid accumulation, firmness of the tissue, skin loss, decrease or no sensation, fat necrosis, bleeding, infection, healing delay.  Deep vein thrombosis, cardiac and pulmonary complications are risks to any procedure.  There are risks of anesthesia, changes to skin sensation and injury to nerves or blood vessels.  The muscle can be temporarily or permanently injured.  You may have an allergic reaction to tape, suture, glue, blood products which can result in skin discoloration, swelling, pain, skin lesions, poor healing.  Any of these can lead to the need for revisonal surgery or stage procedures.  Weight gain and weigh loss can also effect the long term appearance. The results are not guaranteed to last a lifetime.  Future surgery may be required.      Electronically signed by: Evelena Leyden, PA-C 03/12/2023 9:54 AM

## 2023-03-16 ENCOUNTER — Other Ambulatory Visit: Payer: Self-pay | Admitting: Anesthesiology

## 2023-03-16 ENCOUNTER — Ambulatory Visit
Admission: RE | Admit: 2023-03-16 | Discharge: 2023-03-16 | Disposition: A | Discharge status: Home / Self Care | Payer: BC Managed Care – PPO | Source: Ambulatory Visit | Attending: Anesthesiology | Admitting: Anesthesiology

## 2023-03-16 DIAGNOSIS — Z2089 Contact with and (suspected) exposure to other communicable diseases: Secondary | ICD-10-CM

## 2023-03-21 DIAGNOSIS — Z411 Encounter for cosmetic surgery: Secondary | ICD-10-CM

## 2023-03-26 ENCOUNTER — Telehealth: Payer: Self-pay | Admitting: Physician Assistant

## 2023-03-26 ENCOUNTER — Ambulatory Visit (INDEPENDENT_AMBULATORY_CARE_PROVIDER_SITE_OTHER): Payer: Self-pay | Admitting: Student

## 2023-03-26 DIAGNOSIS — Z9889 Other specified postprocedural states: Secondary | ICD-10-CM

## 2023-03-26 NOTE — Progress Notes (Signed)
Patient is a 52 year old female who underwent mini abdominoplasty with liposuction on 03/21/2023 with Dr. Ulice Bold.  She presents to the clinic today with concerns for bleeding and drainage.  Today, patient reports she is doing okay.  She states that she has been having some drainage from her drain site.  She also reports she has had a little bit of pain at the drain site as well.  Patient denies any fevers or chills.  She reports she is eating and drinking without issue.  She does state that the hydrocodone has made her a little nauseous, but she stopped taking that yesterday.  She states now she is taking ibuprofen.  She denies any other issues.  Patient reports that approximately 40 cc has been coming out of her drain per day.  Chaperone present on exam.  On exam, patient is sitting upright in no acute distress.  Abdomen is soft and nontender.  There is some bruising noted bilaterally to the flanks.  There is no overlying erythema.  Mepilex border dressing to the umbilicus was removed.  Incision to umbilicus is intact with Steri-Strips.  Umbilicus appears viable.  Aquacel dressings are for the most part clean dry and intact.  The area just above the drain site was a little bit soiled with drainage.  Aquacel dressing was removed.  Incision is intact with Steri-Strips.  Some of the Steri-Strips were removed when the Aquacel dressing was removed.  There is some dried blood surrounding the Steri-Strips.  There is no active bleeding on exam.  There is no surrounding erythema.  Incision appears to be intact.  Area was cleaned and Steri-Strips were replaced with new ones.  To the drain site, there is some mild surrounding erythema.  There is no active drainage on exam.  There is a small amount of irritation to the skin near the drain site that appears to be irritation from some tape.  There is serosanguineous drainage in the JP drain.  It appears to be functioning properly.  I discussed with the patient that it  appears that she has some irritation around her drain site and it does not appear to be infected at this time.  I discussed with her that she can apply a small amount of Vaseline around the drain site and to the area of irritation from the tape to the skin and then apply a gauze dressing and an ABD pad or maxi pad over the drain site daily or as needed for saturation.  Patient to avoid adhesives.  I discussed with the patient to continue to monitor the area and if she has any worsening concerns about it to let us know.  Patient expressed understanding.  I discussed with the patient that she should continue compression at all times.  I recommended that she place like a foam to her flanks where the liposuction was done.  I also recommended to the patient that she keep her drain bulb charged at all time.  Patient expressed understanding.  I discussed with the patient that she may take Tylenol and ibuprofen for pain.  We discussed that she may alternate these throughout the day if needed for pain.  Patient expressed understanding.  Patient is to follow-up next week.  I instructed the patient to call if she has any questions or concerns in the meantime.

## 2023-03-26 NOTE — Telephone Encounter (Signed)
Has a lot of drainage and and some bleeding and wants to know if she should come in today or just wait to see Dr Ulice Bold, Please Advise

## 2023-03-27 ENCOUNTER — Encounter: Payer: BC Managed Care – PPO | Admitting: Plastic Surgery

## 2023-03-28 ENCOUNTER — Encounter: Payer: Self-pay | Admitting: Student

## 2023-03-28 ENCOUNTER — Ambulatory Visit (INDEPENDENT_AMBULATORY_CARE_PROVIDER_SITE_OTHER): Payer: Self-pay | Admitting: Student

## 2023-03-28 VITALS — BP 107/75 | HR 85 | Temp 98.2°F

## 2023-03-28 DIAGNOSIS — Z9889 Other specified postprocedural states: Secondary | ICD-10-CM

## 2023-03-28 MED ORDER — DOXYCYCLINE HYCLATE 100 MG PO TABS: 100.0000 mg | ORAL_TABLET | Freq: Two times a day (BID) | ORAL | 0 refills | Status: AC

## 2023-03-28 NOTE — Progress Notes (Signed)
Patient is a 52 year old female who underwent mini abdominoplasty with liposuction on 03/21/2023 with Dr. Ulice Bold.  She presents to the clinic today with concerns for infection.     Patient was last seen in the clinic on 03/26/2023.  At this visit, she reported a little bit of pain in her drain site.  She denies any fevers or chills.  At that time, 40 cc had been coming out of her drain in 24 hours.  On exam, abdomen was soft and nontender.  Incision was intact with Steri-Strips.  There was some mild erythema to the drain site.  Serosanguineous drainage was noted in the JP drain.  Plan was for patient to continue to monitor the area and to apply a little bit of Vaseline to the drain site daily.  Patient was to follow-up in about a week or so.  Today, patient reports that she still having pain at the drain site.  She denies any fevers or chills.  She does report she has a little bit of nausea, but denies any vomiting.  She denies any other issues or concerns at this time.  Patient reports in the past 24 hours, she has put out about 60 cc of drain output.  Vitals were obtained at today's visit.  Vitals are stable.  Patient is afebrile. Vitals:   03/28/23 1042  BP: 107/75  Pulse: 85  Temp: 98.2 F (36.8 C)  SpO2: 97%    Chaperone present on exam.  On exam, patient is sitting upright in no acute distress.  Abdomen is soft and nontender.  There are no fluid collections palpated on exam.  Umbilicus incision is intact with Steri-Strips.  Umbilicus appears viable.  There is no surrounding erythema.  Lower abdominal incision appears to be intact with Steri-Strips.  There is no surrounding erythema or drainage.  Right JP drain is in place and functioning.  There is serosanguineous drainage in the bulb.  There is some surrounding erythema to the drain site, slightly larger than previous exam.  There is also some swelling noted around the drain site as well.  There is no active drainage noted around the drain  site.  Mild tenderness to palpation on exam.  I discussed with the patient that she may have a little bit of cellulitis versus irritation around the drain site.  I discussed with her that given she is still having pain and is having little bit of swelling, we will start her on an antibiotic.  I will start her on doxycycline.  Patient states that she has taken this in the past without any issues.  I discussed with the patient to continue to monitor the drain site and her surgical sites.  I discussed with her that if the area were to become more painful, become more red, more swollen, if she were to develop any fevers or chills, or she has any concerns to reach out to [place].  Patient expressed understanding.  I discussed with the patient to continue to wear compression at all times and to make sure she is recording her drain output.  Patient expressed understanding.  Patient states that she has an appointment on Tuesday.  I recommended that she keep that.  I instructed the patient to call in the meantime if she has any questions or concerns.  Pictures were obtained of the patient and placed in the chart with the patient's or guardian's permission.

## 2023-04-03 ENCOUNTER — Ambulatory Visit (INDEPENDENT_AMBULATORY_CARE_PROVIDER_SITE_OTHER): Payer: Self-pay | Admitting: Plastic Surgery

## 2023-04-03 ENCOUNTER — Encounter: Payer: Self-pay | Admitting: Plastic Surgery

## 2023-04-03 VITALS — BP 124/84 | HR 68 | Wt 161.8 lb

## 2023-04-03 DIAGNOSIS — N62 Hypertrophy of breast: Secondary | ICD-10-CM

## 2023-04-03 DIAGNOSIS — Z719 Counseling, unspecified: Secondary | ICD-10-CM

## 2023-04-03 NOTE — Progress Notes (Signed)
The patient is a 52 year old female here for follow-up after undergoing hernia repair and abdominoplasty.  This was a week ago.  The drain output has been minimal.  No sign of infection.  No sign of hematoma or seroma.  Overall she is very happy.  Continue with the spanx or binder.  Plan to see her back in the next 2 weeks.  She is fine to walk but limit repetitive movements.

## 2023-04-09 NOTE — Progress Notes (Unsigned)
Patient is a pleasant 52 year old female s/p mini abdominoplasty with liposuction performed at time of umbilical hernia repair 03/21/2023 by Dr. Ulice Bold who returns to clinic for postoperative follow-up.  She was last seen here in clinic on 04/03/2023.  At that time, drain output has been minimal and drains were removed without complication or difficulty.  No evidence concerning for hematoma or seroma.  Plan is for continued compressive garments and follow-up in 2 weeks.  Today, patient presents with concern for possible seroma.  She states that she is not sure, but wanted to be evaluated.  She states that when the drains were removed she was advised to return if she noticed any swelling.  She denies any pain symptoms, fever, redness, or other concerns.  She continues to wear compressive garment 24/7 with the exception of showering.  She has been adhering to the activity restrictions, but is hoping that she can begin to increase her activity.  On exam, abdomen is soft, nondistended.  Nontender throughout.  Minimal swelling, certainly no appreciable seroma or other subcutaneous fluid collections.  Umbilicus is healing nicely.  A couple scattered sutures are removed without complication or difficulty.  Some residual Dermabond noted throughout.  The scar is firm throughout, but nontender and with only minimal hypertrophy.  Recommending gentle mechanical massage of her incision as well as a once daily application of Vaseline to help with residual Dermabond.  After 1 week, she can transition to silicone scar treatments.  She states that she already has Skinuva from her recent breast surgery.    Follow-up in 2 weeks, as scheduled.  She does not need to come in for appointment scheduled later this week.  She is reassured by today's benign exam.  Picture(s) obtained of the patient and placed in the chart were with the patient's or guardian's permission.

## 2023-04-10 ENCOUNTER — Ambulatory Visit (INDEPENDENT_AMBULATORY_CARE_PROVIDER_SITE_OTHER): Payer: Self-pay | Admitting: Physician Assistant

## 2023-04-10 VITALS — BP 107/70 | HR 74

## 2023-04-10 DIAGNOSIS — Z9889 Other specified postprocedural states: Secondary | ICD-10-CM

## 2023-04-13 ENCOUNTER — Encounter: Payer: BC Managed Care – PPO | Admitting: Physician Assistant

## 2023-04-26 NOTE — Progress Notes (Signed)
Patient is a pleasant 52 year old female s/p mini abdominoplasty with liposuction performed at time of umbilical hernia repair 03/21/2023 by Dr. Ulice Bold who returns to clinic for postoperative follow-up.   She was last seen here in clinic on 04/10/2023.  At that time, she was concern for possible seroma, but her exam was entirely reassuring.  Some residual Dermabond noted throughout.  Recommended gentle massage of her incision as well as once daily application of thin Vaseline to help with residual Dermabond.  After 1 week, can transition to silicone scar treatments.  Pictures were obtained and placed in chart.  Today, patient is doing well.  No specific complaints.  She has been massaging the incision and still feels mildly firm and inflamed, but otherwise she is pleased with the outcome.  Inquires about activity restrictions.  She states that the spanks still feel comfortable and she plans to continue wearing them for additional support.  On exam, abdomen is soft, nondistended.  Nontender throughout.  Incision well-healed.  Mild firmness underneath the incision, but seemingly softer compared to previous encounter.  No erythema or cellulitic changes.  No incisional wounds noted.  She already has Silagen silicone scar gel at home.  Also discussed silicone scar tapes.  Pictures were already obtained and placed in chart.  She is cleared from our standpoint to begin increasing activity as tolerated.  Compressive garments only if she prefers.  Discussed avoidance of UV injury to the scar.  Follow-up only as needed.  She also was noted to have what appears to be cherry angiomas throughout her abdomen.  Offered laser treatment here in our clinic.  She will call to discuss pricing if she would like to have them treated.

## 2023-04-27 ENCOUNTER — Ambulatory Visit (INDEPENDENT_AMBULATORY_CARE_PROVIDER_SITE_OTHER): Payer: Self-pay | Admitting: Physician Assistant

## 2023-04-27 DIAGNOSIS — Z9889 Other specified postprocedural states: Secondary | ICD-10-CM

## 2024-02-05 ENCOUNTER — Encounter: Payer: Self-pay | Admitting: Plastic Surgery

## 2024-02-05 ENCOUNTER — Ambulatory Visit (INDEPENDENT_AMBULATORY_CARE_PROVIDER_SITE_OTHER): Payer: Self-pay | Admitting: Plastic Surgery

## 2024-02-05 VITALS — Ht 67.0 in | Wt 158.0 lb

## 2024-02-05 DIAGNOSIS — Z719 Counseling, unspecified: Secondary | ICD-10-CM

## 2024-02-05 NOTE — Progress Notes (Signed)
   Subjective:    Patient ID: Marisa Harris, female    DOB: 1971-02-09, 53 y.o.   MRN: [id number]  The patient is a 53 year old female here for follow-up after mini abdominoplasty with liposuction and repair of umbilical hernia.  This was done several months ago.  Overall the patient is doing really well.  She feels like she is got a little bulge under her bellybutton above the pubis area.  Her weight has fluctuated within 2 to 5 pounds.  She is working really diligently on keeping her weight down and being healthy.  She has a specialist she is seeing in [place] who has been a big help.  Her hormones are still not tiptop and so they are working on those.  It looks like her weight is being stored differently than it was 2 years ago.  There seems to be a little more in the abdominal area.      Review of Systems  Constitutional: Negative.   Eyes: Negative.   Cardiovascular: Negative.   Endocrine: Negative.   Genitourinary: Negative.   Musculoskeletal: Negative.        Objective:   Physical Exam HENT:     Head: Atraumatic.  Cardiovascular:     Rate and Rhythm: Normal rate.     Pulses: Normal pulses.  Pulmonary:     Effort: Pulmonary effort is normal.  Abdominal:     Palpations: Abdomen is soft.  Skin:    General: Skin is warm.     Capillary Refill: Capillary refill takes less than 2 seconds.  Neurological:     Mental Status: She is oriented to person, place, and time.  Psychiatric:        Mood and Affect: Mood normal.        Behavior: Behavior normal.        Thought Content: Thought content normal.        Judgment: Judgment normal.        Assessment & Plan:     ICD-10-CM   1. Encounter for counseling  Z71.9        Continue to work on goals with medicine.  I would recommend seeing [place] back in 3 to 6 months.  There is no easy solution to the bulge.  It would most likely be liposuction.  Pictures were obtained of the patient and placed in the chart with the patient's  or guardian's permission.
